# Patient Record
Sex: Female | Born: 1990 | Race: Asian | Hispanic: No | Marital: Married | Smoking: Never smoker
Health system: Southern US, Community
[De-identification: ages and names within clinical notes are randomized; demographics above are authoritative.]

## PROBLEM LIST (undated history)

## (undated) ENCOUNTER — Inpatient Hospital Stay (HOSPITAL_COMMUNITY): Payer: Self-pay

## (undated) DIAGNOSIS — Z789 Other specified health status: Secondary | ICD-10-CM

## (undated) HISTORY — PX: APPENDECTOMY: SHX54

---
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
## 2012-03-13 ENCOUNTER — Emergency Department (INDEPENDENT_AMBULATORY_CARE_PROVIDER_SITE_OTHER)
Admission: EM | Admit: 2012-03-13 | Discharge: 2012-03-13 | Disposition: A | Discharge status: Home / Self Care | Payer: Self-pay | Source: Home / Self Care | Attending: Emergency Medicine | Admitting: Emergency Medicine

## 2012-03-13 ENCOUNTER — Encounter (HOSPITAL_COMMUNITY): Payer: Self-pay | Admitting: Emergency Medicine

## 2012-03-13 DIAGNOSIS — G43909 Migraine, unspecified, not intractable, without status migrainosus: Secondary | ICD-10-CM

## 2012-03-13 DIAGNOSIS — L42 Pityriasis rosea: Secondary | ICD-10-CM

## 2012-03-13 LAB — POCT URINALYSIS DIP (DEVICE)
Glucose, UA: NEGATIVE mg/dL
Hgb urine dipstick: NEGATIVE
Nitrite: NEGATIVE
Protein, ur: NEGATIVE mg/dL
Urobilinogen, UA: 0.2 mg/dL (ref 0.0–1.0)
pH: 6 (ref 5.0–8.0)

## 2012-03-13 MED ORDER — KETOROLAC TROMETHAMINE 30 MG/ML IJ SOLN
INTRAMUSCULAR | Status: AC
  Filled 2012-03-13: qty 1

## 2012-03-13 MED ORDER — DIPHENHYDRAMINE HCL 25 MG PO CAPS
ORAL_CAPSULE | ORAL | Status: AC
  Filled 2012-03-13: qty 1

## 2012-03-13 MED ORDER — DEXAMETHASONE SODIUM PHOSPHATE 10 MG/ML IJ SOLN
INTRAMUSCULAR | Status: AC
  Filled 2012-03-13: qty 1

## 2012-03-13 MED ORDER — DIPHENHYDRAMINE HCL 25 MG PO CAPS
25.0000 mg | ORAL_CAPSULE | Freq: Once | ORAL | Status: AC
  Administered 2012-03-13: 25 mg via ORAL

## 2012-03-13 MED ORDER — DEXAMETHASONE SODIUM PHOSPHATE 10 MG/ML IJ SOLN
10.0000 mg | Freq: Once | INTRAMUSCULAR | Status: AC
  Administered 2012-03-13: 10 mg via INTRAMUSCULAR

## 2012-03-13 MED ORDER — KETOROLAC TROMETHAMINE 30 MG/ML IJ SOLN
30.0000 mg | Freq: Once | INTRAMUSCULAR | Status: AC
  Administered 2012-03-13: 30 mg via INTRAMUSCULAR

## 2012-03-13 NOTE — ED Provider Notes (Signed)
History     CSN: [id number]  Arrival date & time 03/13/12  1735   First MD Initiated Contact with Patient 03/13/12 2017      Chief Complaint  Patient presents with  . Dizziness    (Consider location/radiation/quality/duration/timing/severity/associated sxs/prior treatment) HPI Comments: Pt with similar headache about a year ago.  Did not treat it, eventually it went away.  Today tried otc migraine medicine which helped a little.    Patient is a 21 y.o. female presenting with headaches and rash. The history is provided by the patient and a friend.  Headache The primary symptoms include headaches, dizziness and nausea. Primary symptoms do not include visual change, fever or vomiting. The symptoms began 6 to 12 hours ago. The symptoms are unchanged.  The headache is associated with photophobia. The headache is not associated with visual change or neck stiffness.  Dizziness also occurs with nausea. Dizziness does not occur with vomiting.  Additional symptoms include photophobia. Additional symptoms do not include neck stiffness.  Rash  This is a new problem. The current episode started 6 to 12 hours ago. The problem has been gradually worsening. The problem is associated with nothing. There has been no fever. The rash is present on the abdomen and neck. The pain is at a severity of 0/10. Associated symptoms include itching. She has tried nothing for the symptoms.    Past Medical History  Diagnosis Date  . No pertinent past medical history     Past Surgical History  Procedure Date  . No past surgeries     No family history on file.  History  Substance Use Topics  . Smoking status: Never Smoker   . Smokeless tobacco: Not on file  . Alcohol Use: No    OB History    Grav Para Term Preterm Abortions TAB SAB Ect Mult Living   1 1 1              Review of Systems  Constitutional: Negative for fever and chills.  HENT: Negative for congestion, neck stiffness and sinus  pressure.   Eyes: Positive for photophobia.  Gastrointestinal: Positive for nausea. Negative for vomiting and abdominal pain.  Genitourinary: Negative for flank pain.  Musculoskeletal: Negative for back pain.  Skin: Positive for itching and rash.  Neurological: Positive for dizziness and headaches.    Allergies  Review of patient's allergies indicates no known allergies.  Home Medications   Current Outpatient Rx  Name Route Sig Dispense Refill  . PRENATAL MULTIVITAMIN CH Oral Take 1 tablet by mouth daily. 30 tablet 5  . SENNOSIDES-DOCUSATE SODIUM 8.6-50 MG PO TABS Oral Take 2 tablets by mouth at bedtime. Until you have a bowel movement. 30 tablet 0    BP 99/72  Pulse 93  LMP 03/03/2012  Physical Exam  Constitutional: She is oriented to person, place, and time. She appears well-developed and well-nourished.       Uncomfortable appearing, lying on table with eyes closed.   HENT:  Head: Normocephalic and atraumatic.  Right Ear: Tympanic membrane, external ear and ear canal normal.  Left Ear: Tympanic membrane, external ear and ear canal normal.  Nose: Nose normal.  Mouth/Throat: Oropharynx is clear and moist.  Eyes: EOM are normal. Pupils are equal, round, and reactive to light.       Photophobia during exam  Neck: Normal range of motion. Neck supple.  Cardiovascular: Normal rate and regular rhythm.   Pulmonary/Chest: Breath sounds normal.  Abdominal: Soft. Bowel sounds are normal.  She exhibits no distension. There is no tenderness. There is no rebound and no guarding.  Neurological: She is alert and oriented to person, place, and time.  Skin: Skin is warm and dry. Rash noted. Rash is papular.       Scattered papules on abd, chest and anterior neck appear c/w pityriasis rosea.  No lesions noted on back.     ED Course  Procedures (including critical care time)   Labs Reviewed  POCT URINALYSIS DIP (DEVICE)  POCT PREGNANCY, URINE   No results found.   1. Migraine     2. Pityriasis rosea       MDM  Dr. Lorenz Coaster examined pt's skin with me.  Likely early pityriasis rosea.         Cathlyn Parsons, NP 03/13/12 2105

## 2012-03-13 NOTE — ED Provider Notes (Signed)
Medical screening examination/treatment/procedure(s) were performed by non-physician practitioner and as supervising physician I was immediately available for consultation/collaboration.  Leslee Home, M.D.   Reuben Likes, MD 03/13/12 [phone]

## 2012-03-13 NOTE — Discharge Instructions (Signed)
Please go home and rest.  Drink lots of liquids to keep yourself hydrated.  If you get another headache like this, you should see a regular (family) doctor - you may need medicines for your headaches.

## 2012-03-13 NOTE — ED Notes (Signed)
Pt here with sudden onset dizziness,sensitivity to light and bilat temporal h/a that started this morning.also states poor appetite for 2 mnths.nausea but no vomiting.feels faint.LMP 03/03/12

## 2012-08-21 ENCOUNTER — Observation Stay (HOSPITAL_COMMUNITY): Payer: Medicaid Other | Admitting: Anesthesiology

## 2012-08-21 ENCOUNTER — Inpatient Hospital Stay (HOSPITAL_COMMUNITY)
Admission: EM | Admit: 2012-08-21 | Discharge: 2012-08-23 | DRG: 343 | Disposition: A | Discharge status: Home / Self Care | Payer: Medicaid Other | Attending: General Surgery | Admitting: General Surgery

## 2012-08-21 ENCOUNTER — Emergency Department (HOSPITAL_COMMUNITY): Payer: Medicaid Other

## 2012-08-21 ENCOUNTER — Encounter (HOSPITAL_COMMUNITY): Payer: Self-pay | Admitting: Anesthesiology

## 2012-08-21 ENCOUNTER — Encounter (HOSPITAL_COMMUNITY): Admission: EM | Disposition: A | Discharge status: Home / Self Care | Payer: Self-pay | Source: Home / Self Care

## 2012-08-21 ENCOUNTER — Encounter (HOSPITAL_COMMUNITY): Payer: Self-pay | Admitting: Emergency Medicine

## 2012-08-21 DIAGNOSIS — K37 Unspecified appendicitis: Secondary | ICD-10-CM

## 2012-08-21 DIAGNOSIS — R110 Nausea: Secondary | ICD-10-CM | POA: Diagnosis not present

## 2012-08-21 DIAGNOSIS — K3580 Unspecified acute appendicitis: Secondary | ICD-10-CM

## 2012-08-21 DIAGNOSIS — R109 Unspecified abdominal pain: Secondary | ICD-10-CM

## 2012-08-21 HISTORY — PX: LAPAROSCOPIC APPENDECTOMY: SHX408

## 2012-08-21 LAB — CBC
MCH: 25.8 pg — ABNORMAL LOW (ref 26.0–34.0)
MCHC: 33.2 g/dL (ref 30.0–36.0)
MCV: 77.5 fL — ABNORMAL LOW (ref 78.0–100.0)
Platelets: 211 10*3/uL (ref 150–400)
RDW: 13.2 % (ref 11.5–15.5)

## 2012-08-21 LAB — COMPREHENSIVE METABOLIC PANEL
AST: 14 U/L (ref 0–37)
Albumin: 4.1 g/dL (ref 3.5–5.2)
BUN: 13 mg/dL (ref 6–23)
Calcium: 9.8 mg/dL (ref 8.4–10.5)
Creatinine, Ser: 0.53 mg/dL (ref 0.50–1.10)

## 2012-08-21 LAB — URINALYSIS, ROUTINE W REFLEX MICROSCOPIC
Bilirubin Urine: NEGATIVE
Nitrite: NEGATIVE
Protein, ur: NEGATIVE mg/dL
Specific Gravity, Urine: 1.013 (ref 1.005–1.030)
Urobilinogen, UA: 0.2 mg/dL (ref 0.0–1.0)

## 2012-08-21 LAB — LIPASE, BLOOD: Lipase: 34 U/L (ref 11–59)

## 2012-08-21 LAB — PREGNANCY, URINE: Preg Test, Ur: NEGATIVE

## 2012-08-21 LAB — AMYLASE: Amylase: 84 U/L (ref 0–105)

## 2012-08-21 SURGERY — APPENDECTOMY, LAPAROSCOPIC
Anesthesia: General | Site: Abdomen | Wound class: Contaminated

## 2012-08-21 MED ORDER — ONDANSETRON HCL 4 MG/2ML IJ SOLN
4.0000 mg | Freq: Four times a day (QID) | INTRAMUSCULAR | Status: DC | PRN
  Administered 2012-08-22: 4 mg via INTRAVENOUS
  Filled 2012-08-21: qty 2

## 2012-08-21 MED ORDER — GLYCOPYRROLATE 0.2 MG/ML IJ SOLN
INTRAMUSCULAR | Status: DC | PRN
  Administered 2012-08-21: 0.6 mg via INTRAVENOUS

## 2012-08-21 MED ORDER — DEXAMETHASONE SODIUM PHOSPHATE 10 MG/ML IJ SOLN
INTRAMUSCULAR | Status: DC | PRN
  Administered 2012-08-21: 10 mg via INTRAVENOUS

## 2012-08-21 MED ORDER — NEOSTIGMINE METHYLSULFATE 1 MG/ML IJ SOLN
INTRAMUSCULAR | Status: DC | PRN
  Administered 2012-08-21: 5 mg via INTRAVENOUS

## 2012-08-21 MED ORDER — ROCURONIUM BROMIDE 100 MG/10ML IV SOLN
INTRAVENOUS | Status: DC | PRN
  Administered 2012-08-21: 30 mg via INTRAVENOUS

## 2012-08-21 MED ORDER — ACETAMINOPHEN 10 MG/ML IV SOLN
INTRAVENOUS | Status: AC
  Filled 2012-08-21: qty 100

## 2012-08-21 MED ORDER — PROMETHAZINE HCL 25 MG/ML IJ SOLN
INTRAMUSCULAR | Status: AC
  Filled 2012-08-21: qty 1

## 2012-08-21 MED ORDER — ONDANSETRON HCL 4 MG/2ML IJ SOLN
INTRAMUSCULAR | Status: DC | PRN
  Administered 2012-08-21: 4 mg via INTRAVENOUS

## 2012-08-21 MED ORDER — PIPERACILLIN-TAZOBACTAM 3.375 G IVPB: 3.3750 g | Freq: Three times a day (TID) | INTRAVENOUS | Status: DC

## 2012-08-21 MED ORDER — LACTATED RINGERS IR SOLN
Status: DC | PRN
  Administered 2012-08-21: 1000 mL

## 2012-08-21 MED ORDER — IOHEXOL 300 MG/ML  SOLN
80.0000 mL | Freq: Once | INTRAMUSCULAR | Status: AC | PRN
  Administered 2012-08-21: 80 mL via INTRAVENOUS

## 2012-08-21 MED ORDER — PROPOFOL 10 MG/ML IV BOLUS
INTRAVENOUS | Status: DC | PRN
  Administered 2012-08-21: 120 mg via INTRAVENOUS

## 2012-08-21 MED ORDER — SUCCINYLCHOLINE CHLORIDE 20 MG/ML IJ SOLN
INTRAMUSCULAR | Status: DC | PRN
  Administered 2012-08-21: 100 mg via INTRAVENOUS

## 2012-08-21 MED ORDER — BUPIVACAINE-EPINEPHRINE 0.5% -1:200000 IJ SOLN
INTRAMUSCULAR | Status: DC | PRN
  Administered 2012-08-21: 25 mL

## 2012-08-21 MED ORDER — SODIUM CHLORIDE 0.9 % IV SOLN
3.0000 g | Freq: Four times a day (QID) | INTRAVENOUS | Status: DC
  Administered 2012-08-21: 3 g via INTRAVENOUS
  Filled 2012-08-21 (×4): qty 3

## 2012-08-21 MED ORDER — CHLORHEXIDINE GLUCONATE CLOTH 2 % EX PADS: 6.0000 | MEDICATED_PAD | Freq: Every day | CUTANEOUS | Status: DC

## 2012-08-21 MED ORDER — ACETAMINOPHEN 325 MG PO TABS: 650.0000 mg | ORAL_TABLET | Freq: Four times a day (QID) | ORAL | Status: DC | PRN

## 2012-08-21 MED ORDER — MORPHINE SULFATE 4 MG/ML IJ SOLN
4.0000 mg | Freq: Once | INTRAMUSCULAR | Status: AC
  Administered 2012-08-21: 4 mg via INTRAVENOUS
  Filled 2012-08-21: qty 1

## 2012-08-21 MED ORDER — FENTANYL CITRATE 0.05 MG/ML IJ SOLN
INTRAMUSCULAR | Status: DC | PRN
  Administered 2012-08-21 (×5): 25 ug via INTRAVENOUS

## 2012-08-21 MED ORDER — BUPIVACAINE-EPINEPHRINE (PF) 0.5% -1:200000 IJ SOLN
INTRAMUSCULAR | Status: AC
  Filled 2012-08-21: qty 10

## 2012-08-21 MED ORDER — PROMETHAZINE HCL 25 MG/ML IJ SOLN
6.2500 mg | INTRAMUSCULAR | Status: AC | PRN
  Administered 2012-08-21 (×2): 6.25 mg via INTRAVENOUS

## 2012-08-21 MED ORDER — FENTANYL CITRATE 0.05 MG/ML IJ SOLN: 25.0000 ug | INTRAMUSCULAR | Status: DC | PRN

## 2012-08-21 MED ORDER — LACTATED RINGERS IV SOLN: INTRAVENOUS | Status: DC

## 2012-08-21 MED ORDER — ONDANSETRON HCL 4 MG/2ML IJ SOLN
4.0000 mg | Freq: Once | INTRAMUSCULAR | Status: AC
  Administered 2012-08-21: 4 mg via INTRAVENOUS
  Filled 2012-08-21: qty 2

## 2012-08-21 MED ORDER — HYDROCODONE-ACETAMINOPHEN 5-325 MG PO TABS
1.0000 | ORAL_TABLET | ORAL | Status: DC | PRN
  Administered 2012-08-22 – 2012-08-23 (×4): 1 via ORAL
  Filled 2012-08-21 (×4): qty 1
  Filled 2012-08-21: qty 2

## 2012-08-21 MED ORDER — ACETAMINOPHEN 10 MG/ML IV SOLN
INTRAVENOUS | Status: DC | PRN
  Administered 2012-08-21: 1000 mg via INTRAVENOUS

## 2012-08-21 MED ORDER — ACETAMINOPHEN 650 MG RE SUPP: 650.0000 mg | Freq: Four times a day (QID) | RECTAL | Status: DC | PRN

## 2012-08-21 MED ORDER — LACTATED RINGERS IV SOLN
INTRAVENOUS | Status: DC | PRN
  Administered 2012-08-21: 20:00:00 via INTRAVENOUS

## 2012-08-21 MED ORDER — KCL IN DEXTROSE-NACL 20-5-0.45 MEQ/L-%-% IV SOLN
INTRAVENOUS | Status: DC
  Administered 2012-08-22: 50 mL/h via INTRAVENOUS
  Administered 2012-08-22: 01:00:00 via INTRAVENOUS
  Filled 2012-08-21 (×3): qty 1000

## 2012-08-21 MED ORDER — MORPHINE SULFATE 2 MG/ML IJ SOLN: 1.0000 mg | INTRAMUSCULAR | Status: DC | PRN

## 2012-08-21 MED ORDER — MUPIROCIN 2 % EX OINT
1.0000 "application " | TOPICAL_OINTMENT | Freq: Two times a day (BID) | CUTANEOUS | Status: DC
  Administered 2012-08-22 – 2012-08-23 (×3): 1 via NASAL
  Filled 2012-08-21: qty 22

## 2012-08-21 MED ORDER — KCL IN DEXTROSE-NACL 20-5-0.45 MEQ/L-%-% IV SOLN
INTRAVENOUS | Status: DC
  Filled 2012-08-21 (×2): qty 1000

## 2012-08-21 SURGICAL SUPPLY — 41 items
APPLIER CLIP 5 13 M/L LIGAMAX5 (MISCELLANEOUS)
APPLIER CLIP ROT 10 11.4 M/L (STAPLE) ×2
CANISTER SUCTION 2500CC (MISCELLANEOUS) ×2 IMPLANT
CHLORAPREP W/TINT 26ML (MISCELLANEOUS) ×2 IMPLANT
CLIP APPLIE 5 13 M/L LIGAMAX5 (MISCELLANEOUS) IMPLANT
CLIP APPLIE ROT 10 11.4 M/L (STAPLE) ×1 IMPLANT
CLOTH BEACON ORANGE TIMEOUT ST (SAFETY) ×2 IMPLANT
CUTTER FLEX LINEAR 45M (STAPLE) ×2 IMPLANT
DECANTER SPIKE VIAL GLASS SM (MISCELLANEOUS) ×2 IMPLANT
DERMABOND ADVANCED (GAUZE/BANDAGES/DRESSINGS) ×1
DERMABOND ADVANCED .7 DNX12 (GAUZE/BANDAGES/DRESSINGS) ×1 IMPLANT
DRAPE LAPAROSCOPIC ABDOMINAL (DRAPES) ×2 IMPLANT
ELECT REM PT RETURN 9FT ADLT (ELECTROSURGICAL) ×2
ELECTRODE REM PT RTRN 9FT ADLT (ELECTROSURGICAL) ×1 IMPLANT
GLOVE BIOGEL PI IND STRL 7.0 (GLOVE) IMPLANT
GLOVE BIOGEL PI INDICATOR 7.0 (GLOVE)
GLOVE SS BIOGEL STRL SZ 7.5 (GLOVE) ×1 IMPLANT
GLOVE SUPERSENSE BIOGEL SZ 7.5 (GLOVE) ×1
GOWN STRL NON-REIN LRG LVL3 (GOWN DISPOSABLE) ×2 IMPLANT
GOWN STRL REIN XL XLG (GOWN DISPOSABLE) ×4 IMPLANT
IV LACTATED RINGERS 1000ML (IV SOLUTION) ×2 IMPLANT
KIT BASIN OR (CUSTOM PROCEDURE TRAY) ×2 IMPLANT
NS IRRIG 1000ML POUR BTL (IV SOLUTION) ×2 IMPLANT
PENCIL BUTTON HOLSTER BLD 10FT (ELECTRODE) IMPLANT
POUCH SPECIMEN RETRIEVAL 10MM (ENDOMECHANICALS) ×2 IMPLANT
RELOAD 45 VASCULAR/THIN (ENDOMECHANICALS) IMPLANT
RELOAD STAPLE TA45 3.5 REG BLU (ENDOMECHANICALS) ×2 IMPLANT
SCALPEL HARMONIC ACE (MISCELLANEOUS) ×2 IMPLANT
SET IRRIG TUBING LAPAROSCOPIC (IRRIGATION / IRRIGATOR) ×2 IMPLANT
SLEEVE Z-THREAD 5X100MM (TROCAR) IMPLANT
SOLUTION ANTI FOG 6CC (MISCELLANEOUS) ×2 IMPLANT
STRIP CLOSURE SKIN 1/2X4 (GAUZE/BANDAGES/DRESSINGS) IMPLANT
SUT MNCRL AB 4-0 PS2 18 (SUTURE) ×2 IMPLANT
SUT VICRYL 0 UR6 27IN ABS (SUTURE) ×2 IMPLANT
TOWEL OR 17X26 10 PK STRL BLUE (TOWEL DISPOSABLE) ×2 IMPLANT
TRAY FOLEY CATH 14FRSI W/METER (CATHETERS) ×2 IMPLANT
TRAY LAP CHOLE (CUSTOM PROCEDURE TRAY) ×2 IMPLANT
TROCAR XCEL BLUNT TIP 100MML (ENDOMECHANICALS) ×2 IMPLANT
TROCAR Z-THREAD FIOS 12X100MM (TROCAR) ×2 IMPLANT
TROCAR Z-THREAD FIOS 5X100MM (TROCAR) ×2 IMPLANT
TUBING INSUFFLATION 10FT LAP (TUBING) ×2 IMPLANT

## 2012-08-21 NOTE — ED Notes (Signed)
Tolerating po contrast.

## 2012-08-21 NOTE — ED Provider Notes (Signed)
History     CSN: [id number]  Arrival date & time 08/21/12  0820   First MD Initiated Contact with Patient 08/21/12 [phone]      Chief Complaint  Patient presents with  . Abdominal Pain    (Consider location/radiation/quality/duration/timing/severity/associated sxs/prior treatment) HPI Comments: Ms Valerie [name] presents for evaluation of abdominal pain.  She states she developed sudden onset upper abdominal pain about 2 hours ago.  It is associated with nausea but she has had no vomiting.  She denies any similar previous discomfort and a has had no abdominal surgeries.  She denies dysuria, pyuria, vaginal bleeding, dysmenorrhea, back pain, constipation, melena, BRBPR, and fever.  She denies alcohol or drug abuse.  She also denies any recent pre or post prandial pain.  Patient is a 21 y.o. female presenting with abdominal pain. The history is provided by the patient. No language interpreter was used.  Abdominal Pain The primary symptoms of the illness include abdominal pain, nausea and vomiting. The primary symptoms of the illness do not include fever, fatigue, shortness of breath, diarrhea, hematemesis, hematochezia, dysuria, vaginal discharge or vaginal bleeding. The onset of the illness was sudden. The problem has not changed since onset. The patient states that she believes she is currently not pregnant. The patient has not had a change in bowel habit. Symptoms associated with the illness do not include anorexia, diaphoresis, constipation, hematuria, frequency or back pain.    Past Medical History  Diagnosis Date  . No pertinent past medical history     Past Surgical History  Procedure Date  . No past surgeries     No family history on file.  History  Substance Use Topics  . Smoking status: Never Smoker   . Smokeless tobacco: Not on file  . Alcohol Use: No    OB History    Grav Para Term Preterm Abortions TAB SAB Ect Mult Living   1 1 1              Review of Systems    Constitutional: Negative for fever, diaphoresis and fatigue.  HENT: Negative.   Eyes: Negative.   Respiratory: Negative for cough, shortness of breath and wheezing.   Cardiovascular: Negative for chest pain, palpitations and leg swelling.  Gastrointestinal: Positive for nausea, vomiting and abdominal pain. Negative for diarrhea, constipation, blood in stool, hematochezia, abdominal distention, rectal pain, anorexia and hematemesis.  Genitourinary: Negative for dysuria, frequency, hematuria, flank pain, decreased urine volume, vaginal bleeding, vaginal discharge, difficulty urinating, genital sores, vaginal pain, menstrual problem (LNMP was at the end of last month) and dyspareunia.  Musculoskeletal: Negative for back pain.  Skin: Negative.   Neurological: Negative.   Hematological: Negative.   Psychiatric/Behavioral: Negative.     Allergies  Review of patient's allergies indicates no known allergies.  Home Medications  No current outpatient prescriptions on file.  BP 98/68  Pulse 91  Temp 97.7 F (36.5 C) (Oral)  Resp 22  SpO2 100%  Physical Exam  Nursing note and vitals reviewed. Constitutional: She is oriented to person, place, and time. She appears well-developed and well-nourished. She is cooperative. She appears ill. No distress.  HENT:  Head: Normocephalic and atraumatic.  Right Ear: External ear normal.  Left Ear: External ear normal.  Nose: Nose normal.  Mouth/Throat: Oropharynx is clear and moist. No oropharyngeal exudate.  Eyes: Conjunctivae normal are normal. Pupils are equal, round, and reactive to light. Right eye exhibits no discharge. Left eye exhibits no discharge. No scleral icterus.  Neck: Normal  range of motion. Neck supple. No JVD present. No tracheal deviation present.  Cardiovascular: Normal rate, regular rhythm, normal heart sounds and intact distal pulses.  Exam reveals no gallop and no friction rub.   No murmur heard. Pulmonary/Chest: Effort normal  and breath sounds normal. No stridor. No respiratory distress. She has no wheezes. She has no rales. She exhibits no tenderness.  Abdominal: Soft. Normal appearance. She exhibits no shifting dullness, no distension, no pulsatile liver, no fluid wave, no abdominal bruit, no ascites, no pulsatile midline mass and no mass. Bowel sounds are decreased. There is no hepatosplenomegaly. There is tenderness in the epigastric area. There is guarding. There is no rigidity, no rebound, no CVA tenderness, no tenderness at McBurney's point and negative Murphy's sign. No hernia.       Pain is all reproduced in the upper abdomen.   Musculoskeletal: Normal range of motion. She exhibits no edema.  Lymphadenopathy:    She has no cervical adenopathy.  Neurological: She is alert and oriented to person, place, and time.  Skin: Skin is warm. No rash noted. She is diaphoretic. No erythema. No pallor.  Psychiatric: She has a normal mood and affect. Her behavior is normal.    ED Course  Procedures (including critical care time)   Labs Reviewed  CBC  COMPREHENSIVE METABOLIC PANEL  LIPASE, BLOOD  AMYLASE  PREGNANCY, URINE  URINALYSIS, ROUTINE W REFLEX MICROSCOPIC   No results found.   No diagnosis found.    MDM  Pt presents for evaluation of upper abdominal pain.  She appears uncomfortable.  Note stable vital signs (borderline low BP but feel this is likely a normal value for her - will follow closely).  Pt has upper abd pain.  Will obtain basic belly labs and treat her pain/nausea.  Will reassess as results become available.  1450.  Pt stable, NAD.  Pt's pain started as upper abd pain but has now started to localize to the lower abdomen.  Discussed her CT findings with the reading radiologist.  Pt has evidence of an early acute appendicitis.  Consult placed with the general surgery group for further evaluation and mgmnt.      Tobin Chad, MD 08/21/12 [phone]

## 2012-08-21 NOTE — Anesthesia Preprocedure Evaluation (Addendum)
Anesthesia Evaluation  Patient identified by MRN, date of birth, ID band Patient awake    Reviewed: Allergy & Precautions, H&P , NPO status , Patient's Chart, lab work & pertinent test results  Airway Mallampati: II TM Distance: >3 FB Neck ROM: full    Dental No notable dental hx. (+) Teeth Intact and Dental Advisory Given   Pulmonary neg pulmonary ROS,  breath sounds clear to auscultation  Pulmonary exam normal       Cardiovascular Exercise Tolerance: Good negative cardio ROS  Rhythm:regular Rate:Normal     Neuro/Psych negative neurological ROS  negative psych ROS   GI/Hepatic negative GI ROS, Neg liver ROS,   Endo/Other  negative endocrine ROS  Renal/GU negative Renal ROS  negative genitourinary   Musculoskeletal   Abdominal   Peds  Hematology negative hematology ROS (+)   Anesthesia Other Findings   Reproductive/Obstetrics negative OB ROS                           Anesthesia Physical Anesthesia Plan  ASA: I and emergent  Anesthesia Plan: General   Post-op Pain Management:    Induction: Intravenous, Rapid sequence and Cricoid pressure planned  Airway Management Planned: Oral ETT  Additional Equipment:   Intra-op Plan:   Post-operative Plan: Extubation in OR  Informed Consent: I have reviewed the patients History and Physical, chart, labs and discussed the procedure including the risks, benefits and alternatives for the proposed anesthesia with the patient or authorized representative who has indicated his/her understanding and acceptance.   Dental Advisory Given  Plan Discussed with: CRNA and Surgeon  Anesthesia Plan Comments:         Anesthesia Quick Evaluation  

## 2012-08-21 NOTE — Preoperative (Signed)
Beta Blockers   Reason not to administer Beta Blockers:Not Applicable 

## 2012-08-21 NOTE — H&P (Signed)
Agree with above.  Early acute appendicitis with no sign of perforation or abscess.  Recommend laparoscopic appendectomy.  The surgical procedure has been discussed with the patient.  Potential risks, benefits, alternative treatments, and expected outcomes have been explained.  All of the patient's questions at this time have been answered.  The likelihood of reaching the patient's treatment goal is good.  The patient understand the proposed surgical procedure and wishes to proceed.  Dr. Johna Sheriff is on call tonight and will assume her care.  Wilmon Arms. Corliss Skains, MD, [facility] Surgery  08/21/2012 4:49 PM

## 2012-08-21 NOTE — Progress Notes (Signed)
[facility] ED CM noted no pcp listed for medicaid pt Spoke with pt and her female family member at bedside who states pt does not have a pcp listed on her medicaid card CM reviewed scanned active medicaid card since 2012 and no pcp listed

## 2012-08-21 NOTE — ED Notes (Signed)
Pt desat to 88% post morphine admin. Pt placed on 2L,now 100%. Family at bedside

## 2012-08-21 NOTE — ED Notes (Signed)
Called CT to notify pt completed contrast

## 2012-08-21 NOTE — Op Note (Signed)
Preoperative Diagnosis: Appendicitis [541] ABD PAIN  Postoprative Diagnosis: Appendicitis [541] ABD PAIN  Procedure: Procedure(s): APPENDECTOMY LAPAROSCOPIC   Surgeon: Glenna Fellows T   Assistants: None  Anesthesia:  General endotracheal anesthesiaDiagnos  Indications:   Patient is a 21 year old female with approximately 24 hours of periumbilical and right lower quadrant abdominal pain. CT scan has shown evidence of acute appendicitis. We have recommended proceeding with emergency laparoscopic appendectomy and she is in agreement.  Procedure Detail:  Patient is brought to the operating room, placed in the supine position on the operating table, and general endotracheal anesthesia induced. She had received preoperative IV antibiotics. Foley catheter was placed. The abdomen was widely sterilely prepped and draped. Patient timeout was performed and correct procedure verified. Access was obtained with a 1 cm incision at the umbilicus with an open Hassan technique and the trocar was placed through a mattress suture of 0 Vicryl. Under direct vision a 5 mm trocar was placed in the right upper quadrant and a 12 mm trocar in the left lower quadrant. The appendix was easily identified lying medial and inferior to the cecum and was acutely inflamed with edema and very early exudate at the tip. The base was normal. The appendix was elevated and the mesal appendix was sequentially divided with the harmonic scalpel until the appendix was completely freed down to the tip of the cecum. The appendix was then divided across the tip of the cecum with a single firing of the Endo GIA 45 mm blue load stapler. The appendix was placed in an Endo Catch bag and brought out through the umbilical incision. The abdomen was irrigated and complete hemostasis assured. Trochars were removed under direct vision all CO2 evacuated. A mattress suture of 0 Vicryl was placed in the anterior fashion the left lower quadrant trocar  site. Skin incisions were closed with subcuticular Monocryl and Dermabond. Patient was taken to recovery in good condition.  Findings: Early acute appendicitis  Estimated Blood Loss:  Minimal         Drains: none  Blood Given: none          Specimens: appendix        Complications:  * No complications entered in OR log *         Disposition: PACU - hemodynamically stable.         Condition: stable

## 2012-08-21 NOTE — Progress Notes (Signed)
[facility] ED Cm spoke with mother and provided her with a list of available [place] [employer] and other resources including DSS, health department, medication resources and financial resources Mother voiced appreciation

## 2012-08-21 NOTE — H&P (Signed)
Valerie [name] is an 21 y.o. female.   Chief Complaint: Acute onset of abdominal pain HPI: Patient's a 21 year old woman from [place] who started having abdominal pain this morning. It appears to be located initially around her umbilicus more the right side. Pain got worse and she developed nausea.  She came to the ER.  Work up here shows a WBC of 9.9. H./H.= 12.0/36, CMP is normal, lipase is normal. CT scan shows a normal liver spleen pancreas gallbladder adrenal kidneys. There is no small bowel obstruction or diverticulitis the appendix shows wall enhancement measuring 7.1 mm at the tip and has minimal stranding around the tip. They thought it was suspicious for early appendicitis. In the ER her pain is better with pain medication, she has no further nausea. She has ongoing discomfort mostly on the right side going to her back. Her tenderness is primarily in the right lower quadrant. We were asked to see in consultation. Post partum 10 months, breast feeding.  Past Medical History  Diagnosis Date  . She has been seen once in ER here for headache     Past Surgical History  Procedure Date  . No past surgeries     No family history on file. Social History:  reports that she has never smoked. She does not have any smokeless tobacco history on file. She reports that she does not drink alcohol or use illicit drugs.Both parents are in good health, sister is in good health.  She has a 5 month old in good health.  Prior to Admission medications   Not on File    Allergies: No Known Allergies   (Not in a hospital admission)  Results for orders placed during the hospital encounter of 08/21/12 (from the past 48 hour(s))  CBC     Status: Abnormal   Collection Time   08/21/12  9:22 AM      Component Value Range Comment   WBC 9.9  4.0 - 10.5 K/uL    RBC 4.66  3.87 - 5.11 MIL/uL    Hemoglobin 12.0  12.0 - 15.0 g/dL    HCT 16.1  09.6 - 04.5 %    MCV 77.5 (*) 78.0 - 100.0 fL    MCH 25.8 (*) 26.0 -  34.0 pg    MCHC 33.2  30.0 - 36.0 g/dL    RDW 40.9  81.1 - 91.4 %    Platelets 211  150 - 400 K/uL   COMPREHENSIVE METABOLIC PANEL     Status: Abnormal   Collection Time   08/21/12  9:22 AM      Component Value Range Comment   Sodium 139  135 - 145 mEq/L    Potassium 3.5  3.5 - 5.1 mEq/L    Chloride 103  96 - 112 mEq/L    CO2 20  19 - 32 mEq/L    Glucose, Bld 106 (*) 70 - 99 mg/dL    BUN 13  6 - 23 mg/dL    Creatinine, Ser 7.82  0.50 - 1.10 mg/dL    Calcium 9.8  8.4 - 95.6 mg/dL    Total Protein 7.2  6.0 - 8.3 g/dL    Albumin 4.1  3.5 - 5.2 g/dL    AST 14  0 - 37 U/L    ALT 6  0 - 35 U/L    Alkaline Phosphatase 57  39 - 117 U/L    Total Bilirubin 0.3  0.3 - 1.2 mg/dL    GFR calc non Af  Amer >90  >90 mL/min    GFR calc Af Amer >90  >90 mL/min   LIPASE, BLOOD     Status: Normal   Collection Time   08/21/12  9:22 AM      Component Value Range Comment   Lipase 34  11 - 59 U/L   AMYLASE     Status: Normal   Collection Time   08/21/12  9:22 AM      Component Value Range Comment   Amylase 84  0 - 105 U/L   PREGNANCY, URINE     Status: Normal   Collection Time   08/21/12 10:12 AM      Component Value Range Comment   Preg Test, Ur NEGATIVE  NEGATIVE   URINALYSIS, ROUTINE W REFLEX MICROSCOPIC     Status: Normal   Collection Time   08/21/12 10:12 AM      Component Value Range Comment   Color, Urine YELLOW  YELLOW    APPearance CLEAR  CLEAR    Specific Gravity, Urine 1.013  1.005 - 1.030    pH 7.5  5.0 - 8.0    Glucose, UA NEGATIVE  NEGATIVE mg/dL    Hgb urine dipstick NEGATIVE  NEGATIVE    Bilirubin Urine NEGATIVE  NEGATIVE    Ketones, ur NEGATIVE  NEGATIVE mg/dL    Protein, ur NEGATIVE  NEGATIVE mg/dL    Urobilinogen, UA 0.2  0.0 - 1.0 mg/dL    Nitrite NEGATIVE  NEGATIVE    Leukocytes, UA NEGATIVE  NEGATIVE MICROSCOPIC NOT DONE ON URINES WITH NEGATIVE PROTEIN, BLOOD, LEUKOCYTES, NITRITE, OR GLUCOSE <1000 mg/dL.   Ct Abdomen Pelvis W Contrast  08/21/2012  *RADIOLOGY  REPORT*  Clinical Data: Right lower quadrant abdomen pain  CT ABDOMEN AND PELVIS WITH CONTRAST  Technique:  Multidetector CT imaging of the abdomen and pelvis was performed following the standard protocol during bolus administration of intravenous contrast.  Contrast: 80mL OMNIPAQUE IOHEXOL 300 MG/ML  SOLN  Comparison: None.  Findings: The liver, spleen, pancreas, gallbladder, adrenal glands and kidneys are normal.  The aorta is normal.  There is no small bowel obstruction or diverticulitis.  The appendix demonstrates wall enhancement, measuring 7.1 mm in the tip and has minimal surrounding stranding along the tip.  Findings are suspicious for early appendicitis.  Images of the pelvis demonstrate fluid filled bladder.  Fluid is identified within normal enhancing uterus, normal for the patient's age.  There is a 2.3 cm cyst in the left ovary.  The lung bases are clear.  IMPRESSION: Findings suspicious for early appendicitis as described.  I discussed the results over the phone with the patient's ER physician Dr. Lorenso Courier on the day of exam at 2:50 p.m.   Original Report Authenticated By: Sherian Rein, M.D.     Review of Systems  Constitutional: Negative for fever, chills, weight loss, malaise/fatigue and diaphoresis.  HENT: Negative.   Eyes: Negative.   Respiratory: Negative.   Cardiovascular: Negative.   Gastrointestinal: Positive for nausea and abdominal pain (abdominal pain mid abdomen at first, now going to RLQ and back.). Negative for diarrhea, constipation, blood in stool and melena.  Genitourinary: Negative.   Musculoskeletal: Negative.   Skin: Negative.   Neurological: Negative.  Negative for weakness.  Endo/Heme/Allergies: Negative.   Psychiatric/Behavioral: Negative.     Blood pressure 109/58, pulse 72, temperature 97.7 F (36.5 C), temperature source Oral, resp. rate 14, SpO2 100.00%. Physical Exam  Constitutional: She is oriented to person, place, and time. She appears well-developed  and well-nourished. No distress.       Thin, female from [place]. She is breast feeding at home. No acute distress, but very uncomfortable. She is from [place] and her cousin is translating for her.   HENT:  Head: Normocephalic and atraumatic.  Nose: Nose normal.  Eyes: Conjunctivae normal and EOM are normal. Pupils are equal, round, and reactive to light. Right eye exhibits no discharge. Left eye exhibits no discharge. No scleral icterus.  Neck: Normal range of motion. Neck supple. No JVD present. No thyromegaly present.  Cardiovascular: Normal rate, regular rhythm, normal heart sounds and intact distal pulses.  Exam reveals no gallop.   No murmur heard. Respiratory: Effort normal and breath sounds normal. No stridor. No respiratory distress. She has no wheezes. She has no rales. She exhibits no tenderness.  GI: Soft. Bowel sounds are normal. She exhibits no distension and no mass. There is tenderness (Pain is currently mostly RLQ and goes to her back.  ). There is no rebound and no guarding.       She says pain is better since pain med.  Musculoskeletal: Normal range of motion. She exhibits no edema.  Lymphadenopathy:    She has no cervical adenopathy.  Neurological: She is alert and oriented to person, place, and time. No cranial nerve deficit.  Skin: Skin is warm and dry. No rash noted. She is not diaphoretic. No erythema. No pallor.  Psychiatric: She has a normal mood and affect. Her behavior is normal. Judgment and thought content normal.     Assessment/Plan 1. Acute appendicitis  Plan:  Will discuss with Dr. Corliss Skains, probable appendectomy tonight.  Tzion Wedel 08/21/2012, 4:03 PM

## 2012-08-21 NOTE — ED Notes (Signed)
Pt scheduled for OR @ 1915. Called OR and was notified that surgery is behind and to go ahead and send pt to room

## 2012-08-21 NOTE — Anesthesia Postprocedure Evaluation (Signed)
  Anesthesia Post-op Note  Patient: Valerie [name]  Procedure(s) Performed: Procedure(s) (LRB): APPENDECTOMY LAPAROSCOPIC (N/A)  Patient Location: PACU  Anesthesia Type: General  Level of Consciousness: awake and alert   Airway and Oxygen Therapy: Patient Spontanous Breathing  Post-op Pain: mild  Post-op Assessment: Post-op Vital signs reviewed, Patient's Cardiovascular Status Stable, Respiratory Function Stable, Patent Airway and No signs of Nausea or vomiting  Last Vitals:  Filed Vitals:   08/21/12 2145  BP: 108/60  Pulse: 94  Temp:   Resp:     Post-op Vital Signs: stable   Complications: No apparent anesthesia complications

## 2012-08-21 NOTE — ED Notes (Signed)
Pt in from home vis [facility]. Pt c/o sudden onset of Abd pain in epigastric region this am. Pt denies N/V/D or fever. Pt in NAD.

## 2012-08-21 NOTE — Transfer of Care (Signed)
Immediate Anesthesia Transfer of Care Note  Patient: Valerie [name]  Procedure(s) Performed: Procedure(s) (LRB): APPENDECTOMY LAPAROSCOPIC (N/A)  Patient Location: PACU  Anesthesia Type: General  Level of Consciousness: sedated, patient cooperative and responds to stimulaton  Airway & Oxygen Therapy: Patient Spontanous Breathing and Patient connected to face mask oxgen  Post-op Assessment: Report given to PACU RN and Post -op Vital signs reviewed and stable  Post vital signs: Reviewed and stable  Complications: No apparent anesthesia complications

## 2012-08-21 NOTE — Progress Notes (Signed)
Pt also seen by [facility] liaison to offer self pay primary care providers in [employer] and health reform information

## 2012-08-22 NOTE — Progress Notes (Signed)
Patient ID: Valerie [name], female   DOB: August 28, 1991, 21 y.o.   MRN: [id number] [facility] Progress Note:   1 Day Post-Op  Subjective: Mental status is clear.  She understands very well.  Objective: Vital signs in last 24 hours: Temp:  [97.7 F (36.5 C)-99.2 F (37.3 C)] 98.3 F (36.8 C) (11/23 0546) Pulse Rate:  [63-95] 71  (11/23 0546) Resp:  [11-22] 16  (11/23 0546) BP: (98-116)/(54-72) 99/63 mmHg (11/23 0546) SpO2:  [91 %-100 %] 100 % (11/23 0546) Weight:  [139 lb (63.05 kg)] 139 lb (63.05 kg) (11/22 1905)  Intake/Output from previous day: 11/22 0701 - 11/23 0700 In: 1750 [I.V.:1750] Out: 2000 [Urine:2000] Intake/Output this shift:    Physical Exam: Work of breathing is normal.  Incisions are bland and covered with Dermabond  Lab Results:  Results for orders placed during the hospital encounter of 08/21/12 (from the past 48 hour(s))  CBC     Status: Abnormal   Collection Time   08/21/12  9:22 AM      Component Value Range Comment   WBC 9.9  4.0 - 10.5 K/uL    RBC 4.66  3.87 - 5.11 MIL/uL    Hemoglobin 12.0  12.0 - 15.0 g/dL    HCT 28.4  13.2 - 44.0 %    MCV 77.5 (*) 78.0 - 100.0 fL    MCH 25.8 (*) 26.0 - 34.0 pg    MCHC 33.2  30.0 - 36.0 g/dL    RDW 10.2  72.5 - 36.6 %    Platelets 211  150 - 400 K/uL   COMPREHENSIVE METABOLIC PANEL     Status: Abnormal   Collection Time   08/21/12  9:22 AM      Component Value Range Comment   Sodium 139  135 - 145 mEq/L    Potassium 3.5  3.5 - 5.1 mEq/L    Chloride 103  96 - 112 mEq/L    CO2 20  19 - 32 mEq/L    Glucose, Bld 106 (*) 70 - 99 mg/dL    BUN 13  6 - 23 mg/dL    Creatinine, Ser 4.40  0.50 - 1.10 mg/dL    Calcium 9.8  8.4 - 34.7 mg/dL    Total Protein 7.2  6.0 - 8.3 g/dL    Albumin 4.1  3.5 - 5.2 g/dL    AST 14  0 - 37 U/L    ALT 6  0 - 35 U/L    Alkaline Phosphatase 57  39 - 117 U/L    Total Bilirubin 0.3  0.3 - 1.2 mg/dL    GFR calc non Af Amer >90  >90 mL/min    GFR calc Af Amer >90  >90 mL/min     LIPASE, BLOOD     Status: Normal   Collection Time   08/21/12  9:22 AM      Component Value Range Comment   Lipase 34  11 - 59 U/L   AMYLASE     Status: Normal   Collection Time   08/21/12  9:22 AM      Component Value Range Comment   Amylase 84  0 - 105 U/L   PREGNANCY, URINE     Status: Normal   Collection Time   08/21/12 10:12 AM      Component Value Range Comment   Preg Test, Ur NEGATIVE  NEGATIVE   URINALYSIS, ROUTINE W REFLEX MICROSCOPIC     Status: Normal   Collection Time  08/21/12 10:12 AM      Component Value Range Comment   Color, Urine YELLOW  YELLOW    APPearance CLEAR  CLEAR    Specific Gravity, Urine 1.013  1.005 - 1.030    pH 7.5  5.0 - 8.0    Glucose, UA NEGATIVE  NEGATIVE mg/dL    Hgb urine dipstick NEGATIVE  NEGATIVE    Bilirubin Urine NEGATIVE  NEGATIVE    Ketones, ur NEGATIVE  NEGATIVE mg/dL    Protein, ur NEGATIVE  NEGATIVE mg/dL    Urobilinogen, UA 0.2  0.0 - 1.0 mg/dL    Nitrite NEGATIVE  NEGATIVE    Leukocytes, UA NEGATIVE  NEGATIVE MICROSCOPIC NOT DONE ON URINES WITH NEGATIVE PROTEIN, BLOOD, LEUKOCYTES, NITRITE, OR GLUCOSE <1000 mg/dL.  SURGICAL PCR SCREEN     Status: Abnormal   Collection Time   08/21/12  7:46 PM      Component Value Range Comment   MRSA, PCR NEGATIVE  NEGATIVE    Staphylococcus aureus POSITIVE (*) NEGATIVE     Radiology/Results: Ct Abdomen Pelvis W Contrast  08/21/2012  *RADIOLOGY REPORT*  Clinical Data: Right lower quadrant abdomen pain  CT ABDOMEN AND PELVIS WITH CONTRAST  Technique:  Multidetector CT imaging of the abdomen and pelvis was performed following the standard protocol during bolus administration of intravenous contrast.  Contrast: 80mL OMNIPAQUE IOHEXOL 300 MG/ML  SOLN  Comparison: None.  Findings: The liver, spleen, pancreas, gallbladder, adrenal glands and kidneys are normal.  The aorta is normal.  There is no small bowel obstruction or diverticulitis.  The appendix demonstrates wall enhancement, measuring 7.1  mm in the tip and has minimal surrounding stranding along the tip.  Findings are suspicious for early appendicitis.  Images of the pelvis demonstrate fluid filled bladder.  Fluid is identified within normal enhancing uterus, normal for the patient's age.  There is a 2.3 cm cyst in the left ovary.  The lung bases are clear.  IMPRESSION: Findings suspicious for early appendicitis as described.  I discussed the results over the phone with the patient's ER physician Dr. Lorenso Courier on the day of exam at 2:50 p.m.   Original Report Authenticated By: Sherian Rein, M.D.     Anti-infectives: Anti-infectives     Start     Dose/Rate Route Frequency Ordered Stop   08/21/12 1645   piperacillin-tazobactam (ZOSYN) IVPB 3.375 g  Status:  Discontinued        3.375 g 12.5 mL/hr over 240 Minutes Intravenous 3 times per day 08/21/12 1648 08/21/12 1649   08/21/12 1645   Ampicillin-Sulbactam (UNASYN) 3 g in sodium chloride 0.9 % 100 mL IVPB  Status:  Discontinued        3 g 100 mL/hr over 60 Minutes Intravenous Every 6 hours 08/21/12 1648 08/21/12 2238          Assessment/Plan: Problem List: Patient Active Problem List  Diagnosis  . Supervision of normal pregnancy    Doing well post appendectomy.  Begin clear liquids.  Hopeful discharge later today or tomorrow 1 Day Post-Op    LOS: 1 day   Matt B. Daphine Deutscher, MD, [facility] [phone] beeper [phone]  08/22/2012 7:59 AM

## 2012-08-22 NOTE — Progress Notes (Signed)
Pt took 90% of clear liquid tray. Up to walk in hall. Became nauseated, no vomiting. Returned to bed and zofran given.  Slept a short while and states feels better now and desires to go home. Dr Daphine Deutscher paged.

## 2012-08-23 MED ORDER — HYDROCODONE-ACETAMINOPHEN 5-325 MG PO TABS: 1.0000 | ORAL_TABLET | ORAL | Status: DC | PRN

## 2012-08-23 NOTE — Discharge Summary (Signed)
Physician Discharge Summary  Patient ID: Valerie [name] MRN: [id number] DOB/AGE: [birth year] 21 y.o.  Admit date: 08/21/2012 Discharge date: 08/23/2012  Admission Diagnoses:  appendicitis  Discharge Diagnoses:  same  Active Problems:  * No active hospital problems. *    Surgery:  Laparoscopic appendectomy  Discharged Condition: improved  Hospital Course:   Had lap appy by Dr. Johna Sheriff.  Did well.  Resumed breast pumping.    Consults: none  Significant Diagnostic Studies: none    Discharge Exam: Blood pressure 87/38, pulse 72, temperature 98.1 F (36.7 C), temperature source Oral, resp. rate 16, height 5\' 3"  (1.6 m), weight 139 lb (63.05 kg), last menstrual period 08/07/2012, SpO2 99.00%. Incisions bland.  Taking diet ok.    Disposition: 01-Home or Self Care  Discharge Orders    Future Orders Please Complete By Expires   Diet - low sodium heart healthy      Increase activity slowly      Discharge instructions      Comments:   Resume previous activity level as tolerated May shower and shampoo   No wound care          Medication List     As of 08/23/2012  7:33 AM    TAKE these medications         HYDROcodone-acetaminophen 5-325 MG per tablet   Commonly known as: NORCO/VICODIN   Take 1-2 tablets by mouth every 4 (four) hours as needed.           Follow-up Information    Follow up with HOXWORTH,BENJAMIN T, MD. In 3 weeks.   Contact information:   [address] [place] [address] [phone]          Signed: [name] 08/23/2012, 7:33 AM

## 2012-08-23 NOTE — Progress Notes (Signed)
Spoke with Dr. Daphine Deutscher. States due to nausea feels prudent to stay in hospital today. Spoke with patient. She agrees to stay.

## 2012-08-24 ENCOUNTER — Encounter (HOSPITAL_COMMUNITY): Payer: Self-pay | Admitting: General Surgery

## 2012-08-24 NOTE — Care Management Note (Signed)
    Page 1 of 1   08/24/2012     10:33:21 AM   CARE MANAGEMENT NOTE 08/24/2012  Patient:  [facility]   Account Number:  [id number]  Date Initiated:  08/24/2012  Documentation initiated by:  Lorenda Ishihara  Subjective/Objective Assessment:     Action/Plan:   Anticipated DC Date:  08/24/2012   Anticipated DC Plan:  HOME/SELF CARE         Choice offered to / List presented to:             Status of service:  Completed, signed off Medicare Important Message given?   (If response is "NO", the following Medicare IM given date fields will be blank) Date Medicare IM given:   Date Additional Medicare IM given:    Discharge Disposition:  HOME/SELF CARE  Per UR Regulation:  Reviewed for med. necessity/level of care/duration of stay  If discussed at Long Length of Stay Meetings, dates discussed:    Comments:

## 2012-09-11 ENCOUNTER — Encounter (HOSPITAL_COMMUNITY): Payer: Self-pay | Admitting: Emergency Medicine

## 2012-09-11 ENCOUNTER — Emergency Department (INDEPENDENT_AMBULATORY_CARE_PROVIDER_SITE_OTHER)
Admission: EM | Admit: 2012-09-11 | Discharge: 2012-09-11 | Disposition: A | Discharge status: Nursing Home (Includes ICF) | Payer: Self-pay | Source: Home / Self Care | Attending: Family Medicine | Admitting: Family Medicine

## 2012-09-11 ENCOUNTER — Emergency Department (HOSPITAL_COMMUNITY)
Admission: EM | Admit: 2012-09-11 | Discharge: 2012-09-11 | Discharge status: Against Medical Advice | Payer: Self-pay | Attending: Emergency Medicine | Admitting: Emergency Medicine

## 2012-09-11 ENCOUNTER — Encounter (HOSPITAL_COMMUNITY): Payer: Self-pay | Admitting: *Deleted

## 2012-09-11 DIAGNOSIS — Z9889 Other specified postprocedural states: Secondary | ICD-10-CM | POA: Insufficient documentation

## 2012-09-11 DIAGNOSIS — R109 Unspecified abdominal pain: Secondary | ICD-10-CM

## 2012-09-11 LAB — CBC WITH DIFFERENTIAL/PLATELET
Basophils Relative: 0 % (ref 0–1)
Eosinophils Absolute: 0.2 10*3/uL (ref 0.0–0.7)
HCT: 38 % (ref 36.0–46.0)
Hemoglobin: 12.7 g/dL (ref 12.0–15.0)
MCH: 26.3 pg (ref 26.0–34.0)
MCHC: 33.4 g/dL (ref 30.0–36.0)
Monocytes Absolute: 0.2 10*3/uL (ref 0.1–1.0)
Monocytes Relative: 5 % (ref 3–12)
Neutrophils Relative %: 31 % — ABNORMAL LOW (ref 43–77)
RDW: 12.9 % (ref 11.5–15.5)

## 2012-09-11 LAB — BASIC METABOLIC PANEL
BUN: 12 mg/dL (ref 6–23)
Calcium: 9.1 mg/dL (ref 8.4–10.5)
Creatinine, Ser: 0.5 mg/dL (ref 0.50–1.10)
GFR calc Af Amer: >90 mL/min (ref >90)
GFR calc non Af Amer: >90 mL/min (ref >90)

## 2012-09-11 LAB — URINALYSIS, ROUTINE W REFLEX MICROSCOPIC
Ketones, ur: 15 mg/dL — AB
Nitrite: NEGATIVE
Protein, ur: NEGATIVE mg/dL
Urobilinogen, UA: 0.2 mg/dL (ref 0.0–1.0)

## 2012-09-11 NOTE — ED Notes (Signed)
Called patient in waiting room to bring back to exam room x2; no answer.  Will try again.

## 2012-09-11 NOTE — ED Notes (Signed)
Pt showed up at [facility]

## 2012-09-11 NOTE — ED Notes (Signed)
Reports abd pain for two/three days now.  Patient did get appendix removed three/four weeks ago and was not able to follow up because she is unable to make copay.

## 2012-09-11 NOTE — ED Provider Notes (Signed)
History     CSN: [id number]  Arrival date & time 09/11/12  1426   First MD Initiated Contact with Patient 09/11/12 1428      Chief Complaint  Patient presents with  . Abdominal Pain    (Consider location/radiation/quality/duration/timing/severity/associated sxs/prior treatment) HPI Comments: 21 year old female with a recent history of laparoscopic appendectomy on November 22. She did not keep her surgery hospital followup appointment due to lack of insurance. Comes complaining of abdominal pain for 2 days. Patient taken hydrocodone without significant relief. Patient points to epigastric and right lower quadrant when asked to localize pain. Denies dysuria or hematuria. She reports decreased appetite and has not been able to drink or eat anything in the last 24 hours. Denies vomiting. Last bowel movement was earlier today and she reports it was normal. No diarrhea. Denies fever or chills. Patient stated her pain improved after her surgery and restarted in the last 2 days. States that she had been eating and drinking fluids fine before 2 days ago.   Past Medical History  Diagnosis Date  . No pertinent past medical history     Past Surgical History  Procedure Date  . No past surgeries   . Laparoscopic appendectomy 08/21/2012    Procedure: APPENDECTOMY LAPAROSCOPIC;  Surgeon: Mariella Saa, MD;  Location: [facility];  Service: General;  Laterality: N/A;    History reviewed. No pertinent family history.  History  Substance Use Topics  . Smoking status: Never Smoker   . Smokeless tobacco: Never Used  . Alcohol Use: No    OB History    Grav Para Term Preterm Abortions TAB SAB Ect Mult Living   1 1 1              Review of Systems  Constitutional: Positive for appetite change. Negative for fever and chills.  Gastrointestinal: Positive for abdominal pain. Negative for vomiting, diarrhea, constipation and blood in stool.  Neurological: Negative for dizziness and headaches.     Allergies  Review of patient's allergies indicates no known allergies.  Home Medications   Current Outpatient Rx  Name  Route  Sig  Dispense  Refill  . HYDROCODONE-ACETAMINOPHEN 5-325 MG PO TABS   Oral   Take 1-2 tablets by mouth every 4 (four) hours as needed.   30 tablet   0     BP 100/65  Pulse 90  Temp 97 F (36.1 C) (Oral)  Resp 16  SpO2 99%  LMP 08/07/2012  Physical Exam  Nursing note and vitals reviewed. Constitutional: She is oriented to person, place, and time. She appears well-developed and well-nourished. No distress.  HENT:  Head: Normocephalic and atraumatic.  Eyes: No scleral icterus.  Neck: No thyromegaly present.  Cardiovascular: Normal heart sounds.   Pulmonary/Chest: Breath sounds normal.  Abdominal: Soft. Bowel sounds are normal. There is tenderness.       Tenderness to palpation with grimacing and voluntary guarding in epigastric, right flank and right lower quadrant. No rebound.  Neurological: She is alert and oriented to person, place, and time.  Skin: She is not diaphoretic.       Well healed small laparoscopic incisions in abdomen.    ED Course  Procedures (including critical care time)  Labs Reviewed - No data to display No results found.   1. Abdominal pain       MDM  21 year old female with a recent history of laparoscopic appendectomy on November 22.  Comes complaining of abdominal pain for 2 days. On exam: She  is afebrile, looks dehydrated with dry ulcerated lips, vital signs stable with a heart rate in 90s. Bowel sounds are present. Abdomen is tender in epigastric, right flank and right lower quadrant. Impress voluntary guarding at no rebound. Decided to transfer to the emergency department for further evaluation and management.        Sharin Grave, MD 09/12/12 [phone]

## 2012-09-11 NOTE — ED Notes (Signed)
Pt sent from [facility] c/o lower abd pain and epigastric pain; pt had recent laproscopic appy; pt sent for further eval

## 2012-09-11 NOTE — ED Notes (Signed)
No answer 3rd time called

## 2012-09-11 NOTE — ED Notes (Signed)
No answer

## 2013-09-30 NOTE — L&D Delivery Note (Signed)
Delivery Note At 2:30 AM a viable and healthy female was delivered via Vaginal, Spontaneous Delivery (Presentation: Right Occiput Anterior).  APGAR: 6, 9; weight  .   Placenta status: Intact, Spontaneous.  Cord: 3 vessels with the following complications: None.   Anesthesia: None  Episiotomy: None Lacerations: None Suture Repair: N/A Est. Blood Loss (mL): 200  Mom to postpartum.  Baby to Couplet care / Skin to Skin.  Benjamin Stainhompson, Kaitlyn Franko L, MD 09/20/2014, 2:55 AM

## 2014-02-24 ENCOUNTER — Other Ambulatory Visit: Payer: Self-pay

## 2014-02-24 LAB — OB RESULTS CONSOLE GC/CHLAMYDIA
CHLAMYDIA, DNA PROBE: NEGATIVE
Chlamydia: NEGATIVE
GC PROBE AMP, GENITAL: NEGATIVE
Gonorrhea: NEGATIVE

## 2014-02-24 LAB — OB RESULTS CONSOLE HIV ANTIBODY (ROUTINE TESTING)
HIV: NONREACTIVE
HIV: NONREACTIVE

## 2014-02-24 LAB — OB RESULTS CONSOLE ABO/RH: RH Type: POSITIVE

## 2014-02-24 LAB — OB RESULTS CONSOLE RUBELLA ANTIBODY, IGM: RUBELLA: IMMUNE

## 2014-02-24 LAB — OB RESULTS CONSOLE ANTIBODY SCREEN: ANTIBODY SCREEN: NEGATIVE

## 2014-02-24 LAB — OB RESULTS CONSOLE RPR
RPR: NONREACTIVE
RPR: NONREACTIVE

## 2014-02-24 LAB — OB RESULTS CONSOLE HEPATITIS B SURFACE ANTIGEN: Hepatitis B Surface Ag: NEGATIVE

## 2014-03-01 ENCOUNTER — Other Ambulatory Visit (HOSPITAL_COMMUNITY): Payer: Self-pay | Admitting: Nurse Practitioner

## 2014-03-01 DIAGNOSIS — Z3682 Encounter for antenatal screening for nuchal translucency: Secondary | ICD-10-CM

## 2014-03-03 ENCOUNTER — Ambulatory Visit (HOSPITAL_COMMUNITY)
Admission: RE | Admit: 2014-03-03 | Discharge: 2014-03-03 | Disposition: A | Discharge status: Home / Self Care | Payer: Medicaid Other | Source: Ambulatory Visit | Attending: Nurse Practitioner | Admitting: Nurse Practitioner

## 2014-03-03 ENCOUNTER — Encounter (HOSPITAL_COMMUNITY): Payer: Self-pay

## 2014-03-03 DIAGNOSIS — Z36 Encounter for antenatal screening of mother: Secondary | ICD-10-CM | POA: Insufficient documentation

## 2014-03-03 DIAGNOSIS — Z3682 Encounter for antenatal screening for nuchal translucency: Secondary | ICD-10-CM

## 2014-03-24 ENCOUNTER — Other Ambulatory Visit (HOSPITAL_COMMUNITY): Payer: Self-pay | Admitting: Nurse Practitioner

## 2014-03-24 DIAGNOSIS — Z3689 Encounter for other specified antenatal screening: Secondary | ICD-10-CM

## 2014-04-21 ENCOUNTER — Ambulatory Visit (HOSPITAL_COMMUNITY)
Admission: RE | Admit: 2014-04-21 | Discharge: 2014-04-21 | Disposition: A | Discharge status: Home / Self Care | Payer: Medicaid Other | Source: Ambulatory Visit | Attending: Nurse Practitioner | Admitting: Nurse Practitioner

## 2014-04-21 DIAGNOSIS — Z3689 Encounter for other specified antenatal screening: Secondary | ICD-10-CM | POA: Diagnosis present

## 2014-08-01 ENCOUNTER — Encounter (HOSPITAL_COMMUNITY): Payer: Self-pay

## 2014-08-15 LAB — OB RESULTS CONSOLE GBS: STREP GROUP B AG: POSITIVE

## 2014-09-12 ENCOUNTER — Other Ambulatory Visit (HOSPITAL_COMMUNITY): Payer: Self-pay | Admitting: Urology

## 2014-09-12 DIAGNOSIS — O480 Post-term pregnancy: Secondary | ICD-10-CM

## 2014-09-15 ENCOUNTER — Other Ambulatory Visit (HOSPITAL_COMMUNITY): Payer: Self-pay | Admitting: Urology

## 2014-09-15 ENCOUNTER — Ambulatory Visit (HOSPITAL_COMMUNITY)
Admission: RE | Admit: 2014-09-15 | Discharge: 2014-09-15 | Disposition: A | Discharge status: Home / Self Care | Payer: Medicaid Other | Source: Ambulatory Visit | Attending: Urology | Admitting: Urology

## 2014-09-15 DIAGNOSIS — O480 Post-term pregnancy: Secondary | ICD-10-CM | POA: Diagnosis not present

## 2014-09-16 DIAGNOSIS — O480 Post-term pregnancy: Secondary | ICD-10-CM | POA: Insufficient documentation

## 2014-09-16 DIAGNOSIS — Z3A40 40 weeks gestation of pregnancy: Secondary | ICD-10-CM | POA: Insufficient documentation

## 2014-09-19 ENCOUNTER — Inpatient Hospital Stay (HOSPITAL_COMMUNITY)
Admission: AD | Admit: 2014-09-19 | Discharge: 2014-09-22 | DRG: 775 | Disposition: A | Discharge status: Home / Self Care | Payer: Medicaid Other | Source: Ambulatory Visit | Attending: Obstetrics & Gynecology | Admitting: Obstetrics & Gynecology

## 2014-09-19 ENCOUNTER — Inpatient Hospital Stay (HOSPITAL_COMMUNITY): Payer: Medicaid Other

## 2014-09-19 ENCOUNTER — Encounter (HOSPITAL_COMMUNITY): Payer: Self-pay

## 2014-09-19 DIAGNOSIS — R079 Chest pain, unspecified: Secondary | ICD-10-CM | POA: Diagnosis present

## 2014-09-19 DIAGNOSIS — O99824 Streptococcus B carrier state complicating childbirth: Secondary | ICD-10-CM | POA: Diagnosis present

## 2014-09-19 DIAGNOSIS — R1012 Left upper quadrant pain: Secondary | ICD-10-CM

## 2014-09-19 DIAGNOSIS — R0602 Shortness of breath: Secondary | ICD-10-CM

## 2014-09-19 DIAGNOSIS — O480 Post-term pregnancy: Principal | ICD-10-CM | POA: Diagnosis present

## 2014-09-19 DIAGNOSIS — Z3A41 41 weeks gestation of pregnancy: Secondary | ICD-10-CM | POA: Diagnosis present

## 2014-09-19 LAB — COMPREHENSIVE METABOLIC PANEL
ALT: <5 U/L (ref 0–35)
ANION GAP: 15 (ref 5–15)
AST: 14 U/L (ref 0–37)
Albumin: 2.8 g/dL — ABNORMAL LOW (ref 3.5–5.2)
Alkaline Phosphatase: 314 U/L — ABNORMAL HIGH (ref 39–117)
BUN: 6 mg/dL (ref 6–23)
CALCIUM: 8.7 mg/dL (ref 8.4–10.5)
CO2: 21 mEq/L (ref 19–32)
Chloride: 103 mEq/L (ref 96–112)
Creatinine, Ser: 0.44 mg/dL — ABNORMAL LOW (ref 0.50–1.10)
GFR calc non Af Amer: >90 mL/min (ref >90)
GLUCOSE: 97 mg/dL (ref 70–99)
Potassium: 3.8 mEq/L (ref 3.7–5.3)
Sodium: 139 mEq/L (ref 137–147)
Total Bilirubin: 0.3 mg/dL (ref 0.3–1.2)
Total Protein: 6.7 g/dL (ref 6.0–8.3)

## 2014-09-19 LAB — CBC
HEMATOCRIT: 30 % — AB (ref 36.0–46.0)
Hemoglobin: 9.3 g/dL — ABNORMAL LOW (ref 12.0–15.0)
MCH: 21.8 pg — AB (ref 26.0–34.0)
MCHC: 31 g/dL (ref 30.0–36.0)
MCV: 70.4 fL — AB (ref 78.0–100.0)
Platelets: 178 10*3/uL (ref 150–400)
RBC: 4.26 MIL/uL (ref 3.87–5.11)
RDW: 15.8 % — AB (ref 11.5–15.5)
WBC: 9.8 10*3/uL (ref 4.0–10.5)

## 2014-09-19 LAB — RAPID STREP SCREEN (MED CTR MEBANE ONLY): Streptococcus, Group A Screen (Direct): NEGATIVE

## 2014-09-19 LAB — TYPE AND SCREEN
ABO/RH(D): A POS
Antibody Screen: NEGATIVE

## 2014-09-19 LAB — ABO/RH: ABO/RH(D): A POS

## 2014-09-19 MED ORDER — PENICILLIN G POTASSIUM 5000000 UNITS IJ SOLR
2.5000 10*6.[IU] | INTRAVENOUS | Status: DC
  Administered 2014-09-20: 2.5 10*6.[IU] via INTRAVENOUS
  Filled 2014-09-19 (×4): qty 2.5

## 2014-09-19 MED ORDER — OXYCODONE-ACETAMINOPHEN 5-325 MG PO TABS: 1.0000 | ORAL_TABLET | ORAL | Status: DC | PRN

## 2014-09-19 MED ORDER — LIDOCAINE HCL (PF) 1 % IJ SOLN
30.0000 mL | INTRAMUSCULAR | Status: DC | PRN
  Filled 2014-09-19: qty 30

## 2014-09-19 MED ORDER — OXYCODONE-ACETAMINOPHEN 5-325 MG PO TABS
2.0000 | ORAL_TABLET | Freq: Once | ORAL | Status: AC
  Administered 2014-09-19: 2 via ORAL
  Filled 2014-09-19: qty 2

## 2014-09-19 MED ORDER — OXYCODONE-ACETAMINOPHEN 5-325 MG PO TABS: 2.0000 | ORAL_TABLET | ORAL | Status: DC | PRN

## 2014-09-19 MED ORDER — OXYTOCIN 40 UNITS IN LACTATED RINGERS INFUSION - SIMPLE MED
1.0000 m[IU]/min | INTRAVENOUS | Status: DC
  Administered 2014-09-19: 2 m[IU]/min via INTRAVENOUS

## 2014-09-19 MED ORDER — DEXTROSE 5 % IV SOLN
2.5000 10*6.[IU] | INTRAVENOUS | Status: DC
  Filled 2014-09-19 (×4): qty 2.5

## 2014-09-19 MED ORDER — PENICILLIN G POTASSIUM 5000000 UNITS IJ SOLR
5.0000 10*6.[IU] | Freq: Once | INTRAVENOUS | Status: AC | PRN
  Administered 2014-09-19: 5 10*6.[IU] via INTRAVENOUS
  Filled 2014-09-19: qty 5

## 2014-09-19 MED ORDER — PENICILLIN G POTASSIUM 5000000 UNITS IJ SOLR
5.0000 10*6.[IU] | Freq: Once | INTRAVENOUS | Status: AC
  Filled 2014-09-19: qty 5

## 2014-09-19 MED ORDER — MISOPROSTOL 25 MCG QUARTER TABLET
25.0000 ug | ORAL_TABLET | ORAL | Status: DC
Start: 2014-09-19 — End: 2014-09-20
  Administered 2014-09-19: 25 ug via VAGINAL
  Filled 2014-09-19: qty 0.25
  Filled 2014-09-19 (×2): qty 1

## 2014-09-19 MED ORDER — ACETAMINOPHEN 325 MG PO TABS
650.0000 mg | ORAL_TABLET | ORAL | Status: DC | PRN
Start: 2014-09-19 — End: 2014-09-20

## 2014-09-19 MED ORDER — TERBUTALINE SULFATE 1 MG/ML IJ SOLN
INTRAMUSCULAR | Status: AC
  Administered 2014-09-19: 0.25 mg via SUBCUTANEOUS
  Filled 2014-09-19: qty 1

## 2014-09-19 MED ORDER — CITRIC ACID-SODIUM CITRATE 334-500 MG/5ML PO SOLN: 30.0000 mL | ORAL | Status: DC | PRN

## 2014-09-19 MED ORDER — DEXTROSE 5 % IV SOLN
5.0000 10*6.[IU] | Freq: Once | INTRAVENOUS | Status: DC
  Filled 2014-09-19: qty 5

## 2014-09-19 MED ORDER — PENICILLIN G POTASSIUM 5000000 UNITS IJ SOLR
2.5000 10*6.[IU] | INTRAVENOUS | Status: DC | PRN
  Filled 2014-09-19: qty 2.5

## 2014-09-19 MED ORDER — ONDANSETRON HCL 4 MG/2ML IJ SOLN: 4.0000 mg | Freq: Four times a day (QID) | INTRAMUSCULAR | Status: DC | PRN

## 2014-09-19 MED ORDER — LACTATED RINGERS IV SOLN
INTRAVENOUS | Status: DC
  Administered 2014-09-19 (×2): via INTRAVENOUS

## 2014-09-19 MED ORDER — FENTANYL CITRATE 0.05 MG/ML IJ SOLN
100.0000 ug | INTRAMUSCULAR | Status: DC | PRN
  Administered 2014-09-20 (×2): 100 ug via INTRAVENOUS
  Filled 2014-09-19 (×2): qty 2

## 2014-09-19 MED ORDER — OXYTOCIN BOLUS FROM INFUSION: 500.0000 mL | INTRAVENOUS | Status: DC

## 2014-09-19 MED ORDER — LACTATED RINGERS IV SOLN: 500.0000 mL | INTRAVENOUS | Status: DC | PRN

## 2014-09-19 MED ORDER — OXYTOCIN 40 UNITS IN LACTATED RINGERS INFUSION - SIMPLE MED
62.5000 mL/h | INTRAVENOUS | Status: DC
  Filled 2014-09-19: qty 1000

## 2014-09-19 MED ORDER — TERBUTALINE SULFATE 1 MG/ML IJ SOLN
0.2500 mg | Freq: Once | INTRAMUSCULAR | Status: AC
  Administered 2014-09-19: 0.25 mg via SUBCUTANEOUS

## 2014-09-19 MED ORDER — TERBUTALINE SULFATE 1 MG/ML IJ SOLN: 0.2500 mg | Freq: Once | INTRAMUSCULAR | Status: AC | PRN

## 2014-09-19 MED ORDER — IOHEXOL 350 MG/ML SOLN
100.0000 mL | Freq: Once | INTRAVENOUS | Status: AC | PRN
  Administered 2014-09-19: 100 mL via INTRAVENOUS

## 2014-09-19 NOTE — H&P (Signed)
LABOR ADMISSION HISTORY AND PHYSICAL  Valerie [name] is a 23 y.o. female G2P1001 with IUP at 3274w0d by 12 week ultrasound sent from clinic for induction of labor after non reactive NST in the setting of post dates.   +Fetal movement. +Irregular contractions. Denies loss of fluid, vaginal bleeding.   At the time of initial evaluation, she reports severe headache, sore throat, cough, left sided chest/abdominal pain, shortness of breath, and overall malaise.   Denies fevers, chills, nausea, vomiting, diarrhea.   She desires an epidural for labor pain control. She plans on breast and bottle feeding.  Dating: By 12 week ultrasound --->  Estimated Date of Delivery: 09/12/14  Prenatal History/Complications: - Anemia - GBS positive  Past Medical History: Past Medical History  Diagnosis Date  . No pertinent past medical history     Past Surgical History: Past Surgical History  Procedure Laterality Date  . No past surgeries    . Laparoscopic appendectomy  08/21/2012    Procedure: APPENDECTOMY LAPAROSCOPIC;  Surgeon: Mariella SaaBenjamin T Hoxworth, MD;  Location: [facility];  Service: General;  Laterality: N/A;    Obstetrical History: OB History    Gravida Para Term Preterm AB TAB SAB Ectopic Multiple Living   2 1 1  0 0 0 0 0 0 1      Gynecological History: Noncontributory  Social History: History   Social History  . Marital Status: Married    Spouse Name: N/A    Number of Children: N/A  . Years of Education: N/A   Social History Main Topics  . Smoking status: Never Smoker   . Smokeless tobacco: Never Used  . Alcohol Use: No  . Drug Use: No  . Sexual Activity: Yes   Other Topics Concern  . None   Social History Narrative    Family History: History reviewed. No pertinent family history.  Allergies: No Known Allergies  Prescriptions prior to admission  Medication Sig Dispense Refill Last Dose  . Acetaminophen (TYLENOL PO) Take by mouth.     Marland Kitchen. HYDROcodone-acetaminophen  (NORCO/VICODIN) 5-325 MG per tablet Take 1-2 tablets by mouth every 4 (four) hours as needed. 30 tablet 0   . Prenatal Vit w/Fe-Methylfol-FA (PNV PO) Take by mouth.        Review of Systems   All systems reviewed and negative except as stated in HPI  Blood pressure 97/44, pulse 96, temperature 98 F (36.7 C), temperature source Oral, resp. rate 20, height 5\' 3"  (1.6 m), weight 137 lb (62.143 kg), last menstrual period 11/28/2013, SpO2 100 %. General appearance: alert, cooperative, mild distress and pale Lungs: mildly increased work of breathing, tachypneic, no accessory muscle use, clear to auscultation bilaterally Heart: tachycardic, no murmurs/rubs/gallops  Abdomen: soft, left upper quadrant tender to palpation, bowel sounds normal Pelvic: 3/50/-2 Extremities: Homans sign is negative, no sign of DVT Neuro: CN II-XII grossly intact, strength 5/5 throughout, sensation intact, DTRs 2+ and equal, normal gait Presentation: cephalic Fetal monitoringBaseline: 145 bpm, Variability: Good {> 6 bpm), Accelerations: Reactive and Decelerations: Absent Uterine activityUterine irritability  Dilation: 3 Effacement (%): 50 Station: -2 Exam by:: McEachron   Prenatal labs: ABO, Rh: --/--/A POS (12/21 1255) Antibody: NEG (12/21 1255) Rubella:   RPR: Nonreactive, Nonreactive (05/28 0000)  HBsAg: Negative (05/28 0000)  HIV: Non-reactive, Non-reactive (05/28 0000)  GBS: Positive (11/16 0000)  1 hr Glucola 85 Genetic screening  Normal Anatomy US WNL   Prenatal Transfer Tool  Maternal Diabetes: No Genetic Screening: Normal Maternal Ultrasounds/Referrals: Normal Fetal Ultrasounds  or other Referrals:  None Maternal Substance Abuse:  No Significant Maternal Medications:  None Significant Maternal Lab Results: Lab values include: Group B Strep positive     Results for orders placed or performed during the hospital encounter of 09/19/14 (from the past 24 hour(s))  CBC   Collection Time:  09/19/14 12:55 PM  Result Value Ref Range   WBC 9.8 4.0 - 10.5 K/uL   RBC 4.26 3.87 - 5.11 MIL/uL   Hemoglobin 9.3 (L) 12.0 - 15.0 g/dL   HCT 16.130.0 (L) 09.636.0 - 04.546.0 %   MCV 70.4 (L) 78.0 - 100.0 fL   MCH 21.8 (L) 26.0 - 34.0 pg   MCHC 31.0 30.0 - 36.0 g/dL   RDW 40.915.8 (H) 81.111.5 - 91.415.5 %   Platelets 178 150 - 400 K/uL  Type and screen   Collection Time: 09/19/14 12:55 PM  Result Value Ref Range   ABO/RH(D) A POS    Antibody Screen NEG    Sample Expiration 09/22/2014   Comprehensive metabolic panel   Collection Time: 09/19/14  1:08 PM  Result Value Ref Range   Sodium 139 137 - 147 mEq/L   Potassium 3.8 3.7 - 5.3 mEq/L   Chloride 103 96 - 112 mEq/L   CO2 21 19 - 32 mEq/L   Glucose, Bld 97 70 - 99 mg/dL   BUN 6 6 - 23 mg/dL   Creatinine, Ser 7.820.44 (L) 0.50 - 1.10 mg/dL   Calcium 8.7 8.4 - 95.610.5 mg/dL   Total Protein 6.7 6.0 - 8.3 g/dL   Albumin 2.8 (L) 3.5 - 5.2 g/dL   AST 14 0 - 37 U/L   ALT <5 0 - 35 U/L   Alkaline Phosphatase 314 (H) 39 - 117 U/L   Total Bilirubin 0.3 0.3 - 1.2 mg/dL   GFR calc non Af Amer >90 >90 mL/min   GFR calc Af Amer >90 >90 mL/min   Anion gap 15 5 - 15  Rapid strep screen   Collection Time: 09/19/14  2:51 PM  Result Value Ref Range   Streptococcus, Group A Screen (Direct) NEGATIVE NEGATIVE    Patient Active Problem List   Diagnosis Date Noted  . Post-dates pregnancy 09/19/2014  . Post-term pregnancy, 40-42 weeks of gestation   . [redacted] weeks gestation of pregnancy   . Supervision of normal pregnancy 09/27/2011    Assessment: Valerie [name] is a 23 y.o. G2P1001 at 4217w0d here for induction of labor secondary to non reactive strip in the setting of postdates.   #Labor: Induction for post dates. Cytotec placed initially with no change, Foley balloon now in place. Consented for c-section.  #CP/SOB: Etiology remains unclear. Given tachycardia and tachypnea, concern for underlying process. DDx includes infection (viral process, pneumonia) vs PE. CXR  negative for infection. Will obtain CTA to rule out pulmonary embolism.  #Pain: Epidural planned.  #FWB: Mostly Category I with intermittent periods of Category II #ID:  GBS positive, PCN once active #MOF: Breast/Bottle #MOC:Undecided #Circ:  Female, need to ask  William DaltonMcEachern, Trashawn Oquendo 09/19/2014, 7:19 PM

## 2014-09-19 NOTE — Progress Notes (Signed)
Pt speaks some English. Informed of [employer] as resource. Patient desires husband to interpret for her when he is here, and will use [employer] if he is not. Release of responsibility for interpretation form signed.

## 2014-09-20 ENCOUNTER — Encounter (HOSPITAL_COMMUNITY): Payer: Self-pay | Admitting: Obstetrics

## 2014-09-20 DIAGNOSIS — O480 Post-term pregnancy: Secondary | ICD-10-CM

## 2014-09-20 DIAGNOSIS — Z3A41 41 weeks gestation of pregnancy: Secondary | ICD-10-CM

## 2014-09-20 DIAGNOSIS — O99824 Streptococcus B carrier state complicating childbirth: Secondary | ICD-10-CM

## 2014-09-20 LAB — CBC
HCT: 28.1 % — ABNORMAL LOW (ref 36.0–46.0)
HEMOGLOBIN: 9 g/dL — AB (ref 12.0–15.0)
MCH: 22.3 pg — ABNORMAL LOW (ref 26.0–34.0)
MCHC: 32 g/dL (ref 30.0–36.0)
MCV: 69.7 fL — AB (ref 78.0–100.0)
PLATELETS: 185 10*3/uL (ref 150–400)
RBC: 4.03 MIL/uL (ref 3.87–5.11)
RDW: 15.6 % — ABNORMAL HIGH (ref 11.5–15.5)
WBC: 12.3 10*3/uL — ABNORMAL HIGH (ref 4.0–10.5)

## 2014-09-20 LAB — RPR

## 2014-09-20 LAB — HIV ANTIBODY (ROUTINE TESTING W REFLEX): HIV 1&2 Ab, 4th Generation: NONREACTIVE

## 2014-09-20 MED ORDER — WITCH HAZEL-GLYCERIN EX PADS: 1.0000 "application " | MEDICATED_PAD | CUTANEOUS | Status: DC | PRN

## 2014-09-20 MED ORDER — TETANUS-DIPHTH-ACELL PERTUSSIS 5-2.5-18.5 LF-MCG/0.5 IM SUSP: 0.5000 mL | Freq: Once | INTRAMUSCULAR | Status: DC

## 2014-09-20 MED ORDER — BENZOCAINE-MENTHOL 20-0.5 % EX AERO
1.0000 "application " | INHALATION_SPRAY | CUTANEOUS | Status: DC | PRN
  Administered 2014-09-20: 1 via TOPICAL
  Filled 2014-09-20: qty 56

## 2014-09-20 MED ORDER — SIMETHICONE 80 MG PO CHEW
80.0000 mg | CHEWABLE_TABLET | ORAL | Status: DC | PRN
  Administered 2014-09-21: 80 mg via ORAL
  Filled 2014-09-20: qty 1

## 2014-09-20 MED ORDER — ONDANSETRON HCL 4 MG/2ML IJ SOLN
4.0000 mg | INTRAMUSCULAR | Status: DC | PRN
Start: 2014-09-20 — End: 2014-09-22

## 2014-09-20 MED ORDER — ONDANSETRON HCL 4 MG PO TABS: 4.0000 mg | ORAL_TABLET | ORAL | Status: DC | PRN

## 2014-09-20 MED ORDER — PRENATAL MULTIVITAMIN CH
1.0000 | ORAL_TABLET | Freq: Every day | ORAL | Status: DC
  Administered 2014-09-20 – 2014-09-21 (×2): 1 via ORAL
  Filled 2014-09-20 (×2): qty 1

## 2014-09-20 MED ORDER — IBUPROFEN 600 MG PO TABS
600.0000 mg | ORAL_TABLET | Freq: Four times a day (QID) | ORAL | Status: DC
  Administered 2014-09-20 – 2014-09-22 (×9): 600 mg via ORAL
  Filled 2014-09-20 (×9): qty 1

## 2014-09-20 MED ORDER — ZOLPIDEM TARTRATE 5 MG PO TABS: 5.0000 mg | ORAL_TABLET | Freq: Every evening | ORAL | Status: DC | PRN

## 2014-09-20 MED ORDER — SENNOSIDES-DOCUSATE SODIUM 8.6-50 MG PO TABS
2.0000 | ORAL_TABLET | ORAL | Status: DC
  Administered 2014-09-21: 2 via ORAL
  Filled 2014-09-20 (×2): qty 2

## 2014-09-20 MED ORDER — OXYCODONE-ACETAMINOPHEN 5-325 MG PO TABS
2.0000 | ORAL_TABLET | ORAL | Status: DC | PRN
  Administered 2014-09-20: 2 via ORAL
  Filled 2014-09-20: qty 2

## 2014-09-20 MED ORDER — DIPHENHYDRAMINE HCL 25 MG PO CAPS: 25.0000 mg | ORAL_CAPSULE | Freq: Four times a day (QID) | ORAL | Status: DC | PRN

## 2014-09-20 MED ORDER — LANOLIN HYDROUS EX OINT
TOPICAL_OINTMENT | CUTANEOUS | Status: DC | PRN
Start: 2014-09-20 — End: 2014-09-22

## 2014-09-20 MED ORDER — DIBUCAINE 1 % RE OINT: 1.0000 "application " | TOPICAL_OINTMENT | RECTAL | Status: DC | PRN

## 2014-09-20 MED ORDER — OXYCODONE-ACETAMINOPHEN 5-325 MG PO TABS
1.0000 | ORAL_TABLET | ORAL | Status: DC | PRN
  Administered 2014-09-20 – 2014-09-21 (×6): 1 via ORAL
  Filled 2014-09-20 (×6): qty 1

## 2014-09-20 NOTE — Progress Notes (Signed)
UR chart review completed.  

## 2014-09-20 NOTE — Lactation Note (Signed)
This note was copied from the chart of [name]. Lactation Consultation Note  Patient Name: Boy Valerie FothergillZainab [name] QMVHQ'IToday's Date: 09/20/2014 Reason for consult: Initial assessment of this mom and baby at 18 hours pp.  Mom is weak and dizzy and her nurse recommends LC return later when mom is stable.  [facility] brochure (given to family member at bedside who will review with mom later).  LC will follow-up later tonight or tomorrow.  Baby has had multiple feedings of 10-40 minutes and consistent LATCH scores=9 per RN assessment.  Baby has also had multiple stools and first void at less than 24 hours of life.  Maternal Data Formula Feeding for Exclusion: Yes Reason for exclusion: Mother's choice to formula and breast feed on admission Has patient been taught Hand Expression?:  (unable to assess; not documented but baby has LATCH scores=9) Does the patient have breastfeeding experience prior to this delivery?:  (unable to assess at this time; not documented by nursing staff)  Feeding Feeding Type: Breast Fed Length of feed: 30 min  LATCH Score/Interventions              LATCH scores=9 at several feedings since delivery        Lactation Tools Discussed/Used   Provided [employer] [facility] broch[facility] and briefly reviewed with family member; mom will need follow-up later tonight or tomorrow  Consult Status Consult Status: Follow-up Date: 09/21/14 Follow-up type: In-patient    Warrick ParisianBryant, Myliah Medel [facility] 09/20/2014, 8:42 PM

## 2014-09-21 LAB — CULTURE, GROUP A STREP

## 2014-09-21 NOTE — Lactation Note (Signed)
This note was copied from the chart of Valerie [name]. Lactation Consultation Note  Follow up visit done.  Mom currently has baby at breast and baby is nursing actively.  Assisted with positioning baby tummy to tummy and closer.  Explained to mom that correct positioning will allow for a deeper latch and prevent nipple soreness.  Patient's cousin present to assist with interpreting.  Discussed cluster feeding.  Recommended good breast massage during feeding.  Encouraged to call for concerns or assist.  Patient Name: Valerie Tamala FothergillZainab Older ZOXWR'UToday's Date: 09/21/2014 Reason for consult: Follow-up assessment   Maternal Data    Feeding Feeding Type: Breast Fed Length of feed: 45 min  LATCH Score/Interventions Latch: Grasps breast easily, tongue down, lips flanged, rhythmical sucking.  Audible Swallowing: A few with stimulation Intervention(s): Alternate breast massage  Type of Nipple: Everted at rest and after stimulation  Comfort (Breast/Nipple): Soft / non-tender     Hold (Positioning): Assistance needed to correctly position infant at breast and maintain latch. Intervention(s): Breastfeeding basics reviewed;Support Pillows  LATCH Score: 8  Lactation Tools Discussed/Used     Consult Status Consult Status: Follow-up Date: 09/22/14 Follow-up type: In-patient    Huston FoleyMOULDEN, Harutyun Monteverde S 09/21/2014, 3:32 PM

## 2014-09-21 NOTE — Progress Notes (Signed)
Post Partum Day #1 Subjective: no complaints, up ad lib and tolerating PO; feels well; breast and bottlefeeding; contraception not discussed  Objective: Blood pressure 94/50, pulse 59, temperature 97.9 F (36.6 C), temperature source Oral, resp. rate 18, height 5\' 3"  (1.6 m), weight 62.143 kg (137 lb), last menstrual period 11/28/2013, SpO2 100 %, unknown if currently breastfeeding.  Physical Exam:  General: alert, cooperative and no distress  Heart: RRR Lungs: nl effort Lochia: appropriate Uterine Fundus: firm DVT Evaluation: No evidence of DVT seen on physical exam.   Recent Labs  09/19/14 1255 09/20/14 0602  HGB 9.3* 9.0*  HCT 30.0* 28.1*    Assessment/Plan: Plan for discharge tomorrow   LOS: 2 days   Cam HaiSHAW, Yilia Sacca CNM 09/21/2014, 9:32 AM

## 2014-09-22 ENCOUNTER — Inpatient Hospital Stay (HOSPITAL_COMMUNITY): Admission: RE | Admit: 2014-09-22 | Payer: Medicaid Other | Source: Ambulatory Visit

## 2014-09-22 MED ORDER — IBUPROFEN 600 MG PO TABS
600.0000 mg | ORAL_TABLET | Freq: Four times a day (QID) | ORAL | Status: DC
Start: 2014-09-22 — End: 2015-10-09

## 2014-09-22 NOTE — Discharge Summary (Signed)
Obstetric Discharge Summary Reason for Admission: induction of labor Prenatal Procedures: NST Intrapartum Procedures: spontaneous vaginal delivery Postpartum Procedures: none Complications-Operative and Postpartum: none HEMOGLOBIN  Date Value Ref Range Status  09/20/2014 9.0* 12.0 - 15.0 g/dL Final   HCT  Date Value Ref Range Status  09/20/2014 28.1* 36.0 - 46.0 % Final  Hospital Course: LABOR ADMISSION HISTORY AND PHYSICAL  Valerie [name] is a 23 y.o. female G2P1001 with IUP at 3369w0d by 12 week ultrasound sent from clinic for induction of labor after non reactive NST in the setting of post dates.   +Fetal movement. +Irregular contractions. Denies loss of fluid, vaginal bleeding.   At the time of initial evaluation, she reports severe headache, sore throat, cough, left sided chest/abdominal pain, shortness of breath, and overall malaise.   Denies fevers, chills, nausea, vomiting, diarrhea.   She desires an epidural for labor pain control. She plans on breast and bottle feeding. Delivery Note At 2:30 AM a viable and healthy female was delivered via Vaginal, Spontaneous Delivery (Presentation: Right Occiput Anterior). APGAR: 6, 9; weight .  Placenta status: Intact, Spontaneous. Cord: 3 vessels with the following complications: None.   Anesthesia: None  Episiotomy: None Lacerations: None Suture Repair: N/A Est. Blood Loss (mL): 200  Mom to postpartum. Baby to Couplet care / Skin to Skin.  Valerie [name], Valerie L, MD 09/20/2014, 2:55 AM  Has done well postpartum. Breastfeeding well.  Ready for discharge.  Physical Exam:  General: alert, cooperative and no distress Lochia: appropriate Uterine Fundus: firm Incision: healing well DVT Evaluation: No evidence of DVT seen on physical exam.  Discharge Diagnoses: Term Pregnancy-delivered  Discharge Information: Date: 09/22/2014 Activity: unrestricted and pelvic rest Diet: routine Medications: PNV and Ibuprofen Condition:  stable and improved Instructions: refer to practice specific booklet Discharge to: home   Newborn Data: Live born female  Birth Weight: 7 lb 12.3 oz (3525 g) APGAR: 6, 9  Home with mother.  [name]Valerie [name] 09/22/2014, 6:55 AM

## 2014-09-22 NOTE — Discharge Instructions (Signed)

## 2014-09-22 NOTE — Lactation Note (Signed)
This note was copied from the chart of Valerie [name]. Lactation Consultation Note  Patient Name: Valerie Tamala FothergillZainab Lewinski WUJWJ'XToday's Date: 09/22/2014 Reason for consult: Follow-up assessment  Baby is 1655 hours old and is latching at the breast and supplemented after per mom choice. Baby recently fed at 0900 at the breast and supplemented 20 ml . LC discussed supply and demand. Per mom nipples are tender, LC assessed breast tissue with moms permission, and LC noted steady flow of colostrum , breast feeling full and warm. LC highly recommended to mom to breast feed on both breast and if baby is satisfied to hold off on supplementing and especially since the milk is coming in .  LC reviewed sore nipple and engorgement prevention and tx . Instructed on use hand pump and comfort gels. Mother informed of post-discharge support and given phone number to the lactation department, including services for phone call assistance; out-patient appointments; and breastfeeding support group. List of other breastfeeding resources in the community given in the handout. Encouraged mother to call for problems or concerns related to breastfeeding.   Maternal Data    Feeding Feeding Type: Bottle Fed - Formula Length of feed: 30 min  LATCH Score/Interventions                Intervention(s): Breastfeeding basics reviewed     Lactation Tools Discussed/Used Tools: Comfort gels;Pump Breast pump type: Manual   Consult Status Consult Status: Complete Date: 09/22/14    Kathrin Greathouseorio, Shaindel Sweeten Ann 09/22/2014, 9:40 AM

## 2015-02-19 IMAGING — CR DG CHEST 2V
2 series · 2 of 2 positions shown · non-contrast
Comparison: None.

CLINICAL DATA: Difficulty breathing and cough

EXAM:
CHEST  2 VIEW

[view not recorded (1 of 2)]
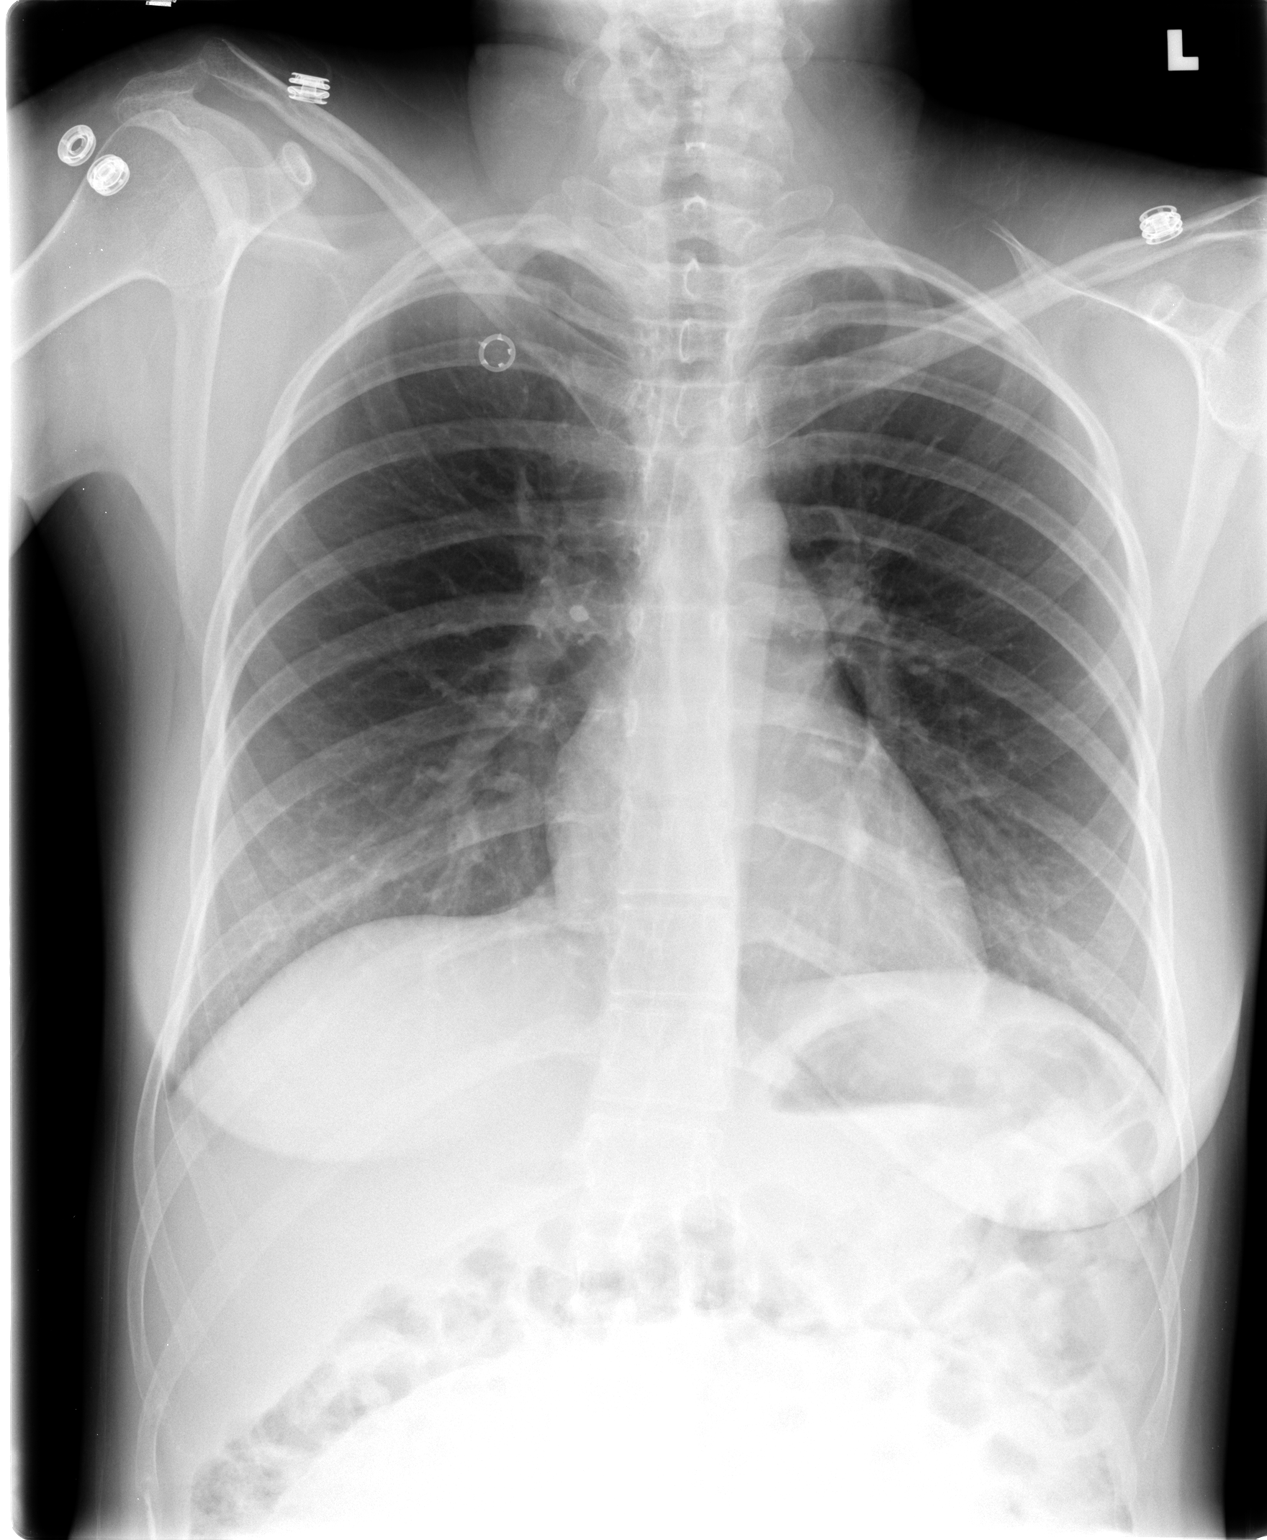

[view not recorded (2 of 2)]
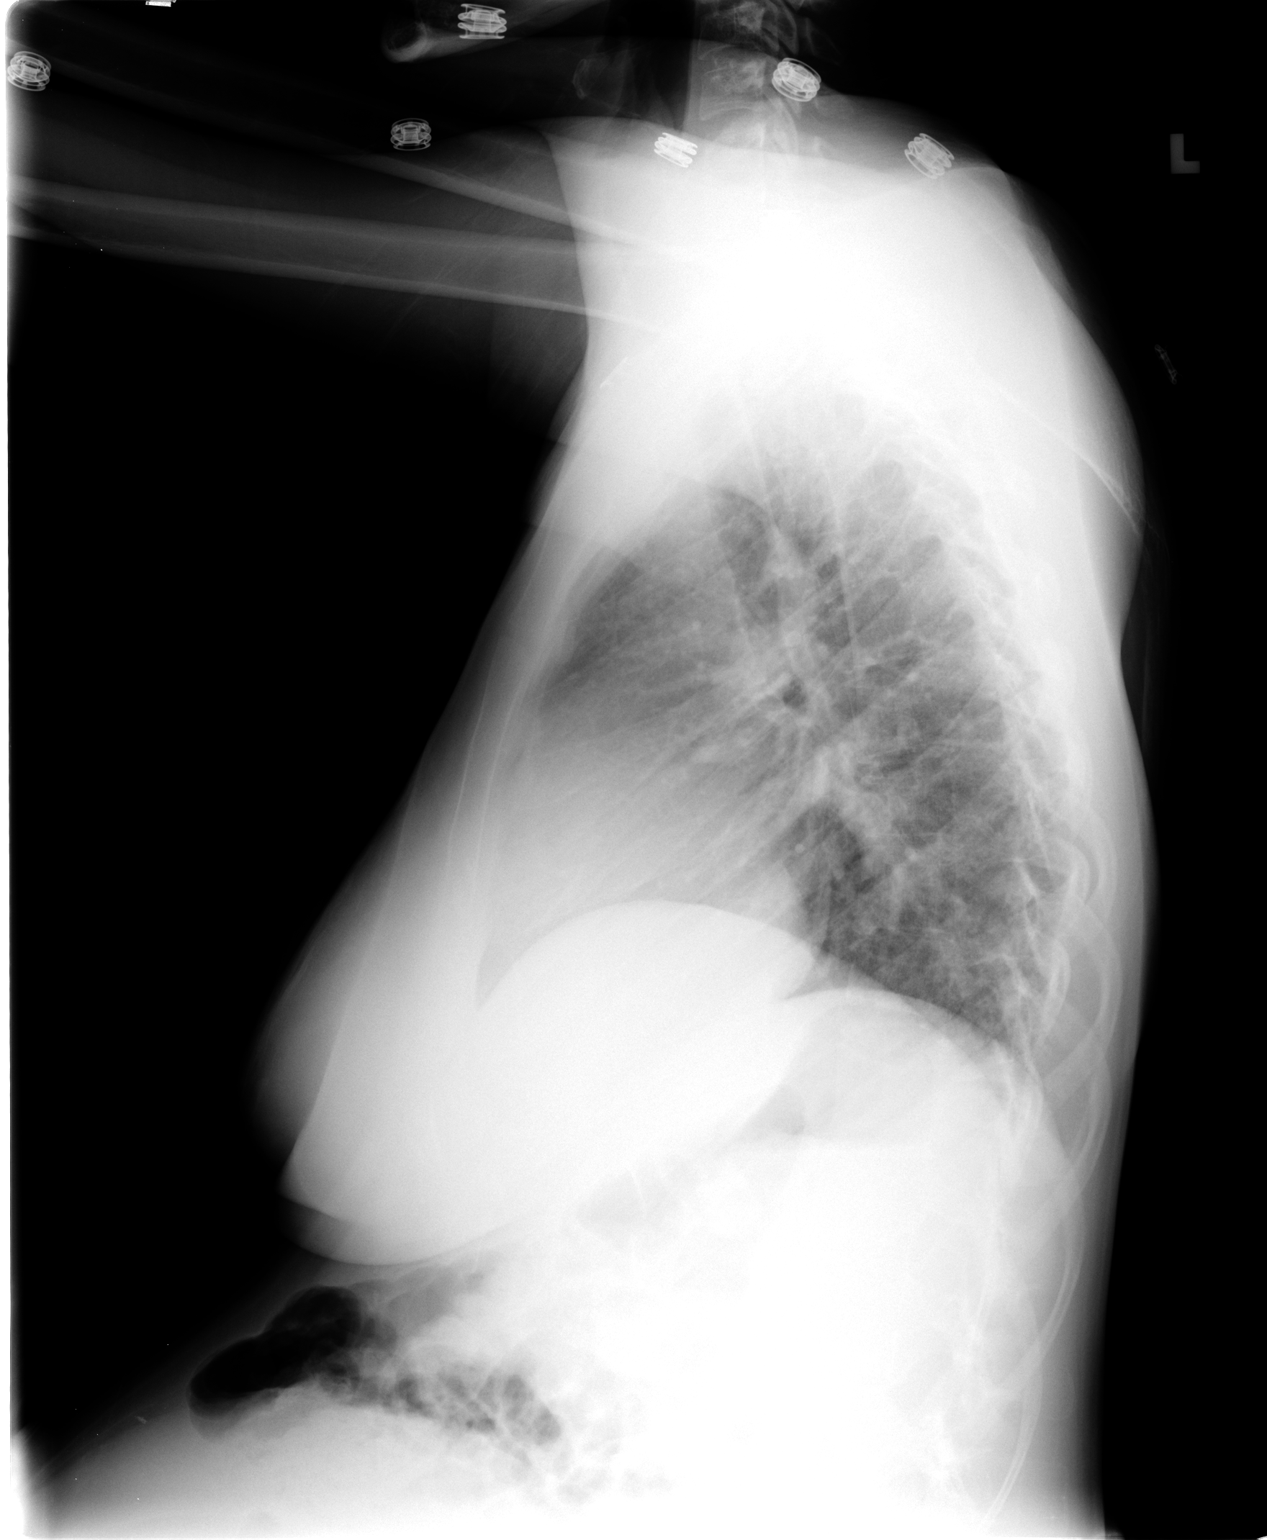

[2 of 2 positions shown; findings below may reference images not displayed]

FINDINGS: Lungs are clear. Heart size and pulmonary vascularity are normal. No
adenopathy. No bone lesions.
IMPRESSION: No edema or consolidation.

## 2015-02-19 IMAGING — CT CT ANGIO CHEST
2 of 6 series · 19 of 36 positions shown · IV contrast (OMNIPAQUE)
Comparison: Prior radiograph from earlier the same day.

EXAM:
CT ANGIOGRAPHY CHEST WITH CONTRAST
TECHNIQUE: Multidetector CT imaging of the chest was performed using the
standard protocol during bolus administration of intravenous
contrast. Multiplanar CT image reconstructions and MIPs were
obtained to evaluate the vascular anatomy.

CONTRAST:  100mL OMNIPAQUE IOHEXOL 350 MG/ML SOLN

[Series 7: pe chest · axial · 0.66mm/px · z∈[-249,-37]mm · 18 of 116 slices shown]
[im 5/116  lung]
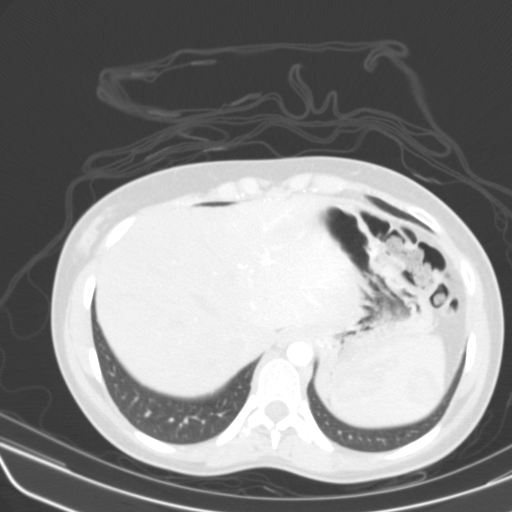
[im 14/116  mediastinal]
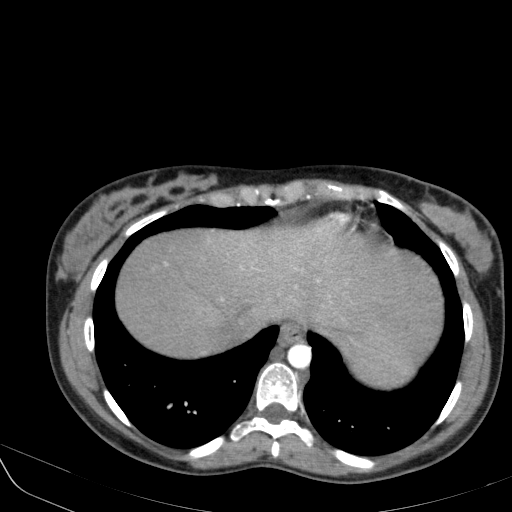
[im 18/116  lung]
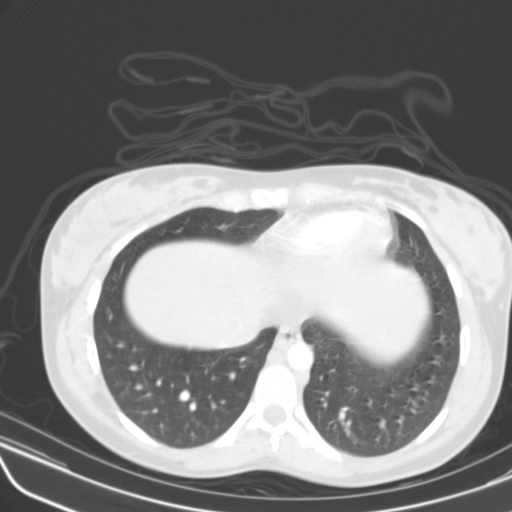
[im 23/116  mediastinal]
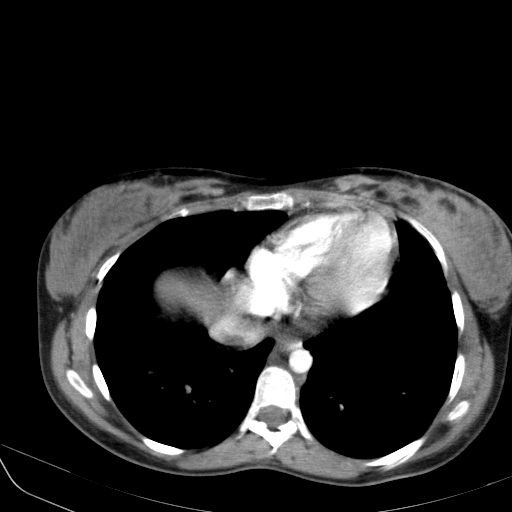
[im 31/116  lung]
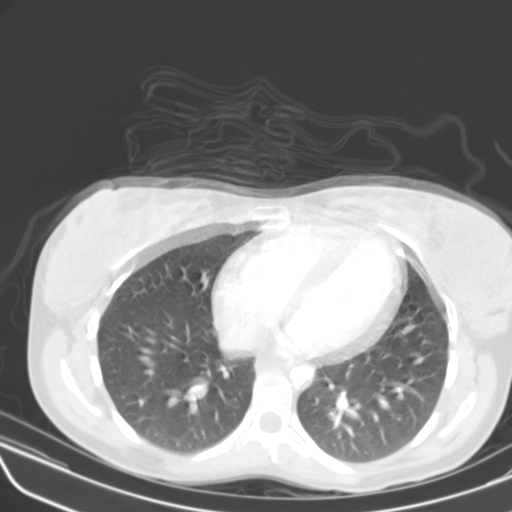
[im 36/116  mediastinal]
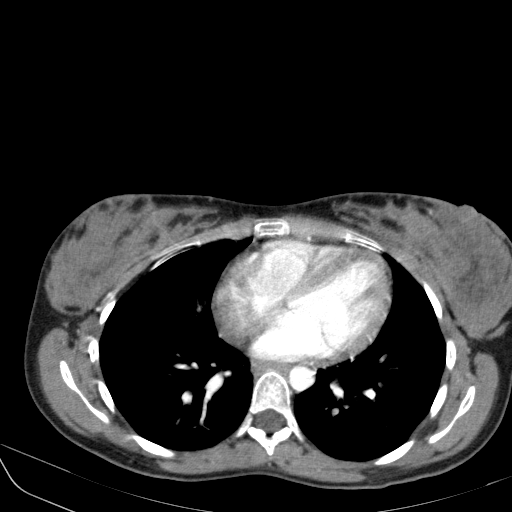
[im 45/116  lung]
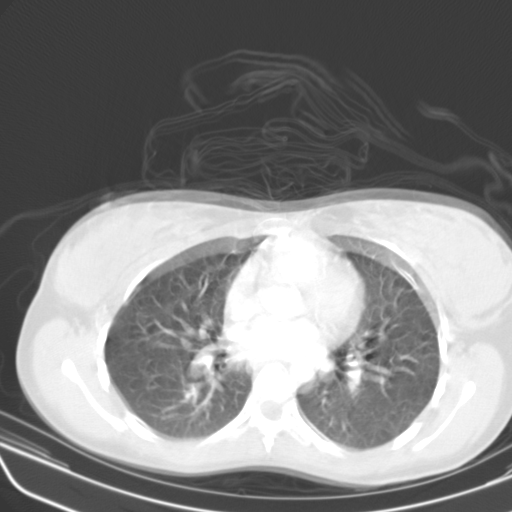
[im 49/116  mediastinal]
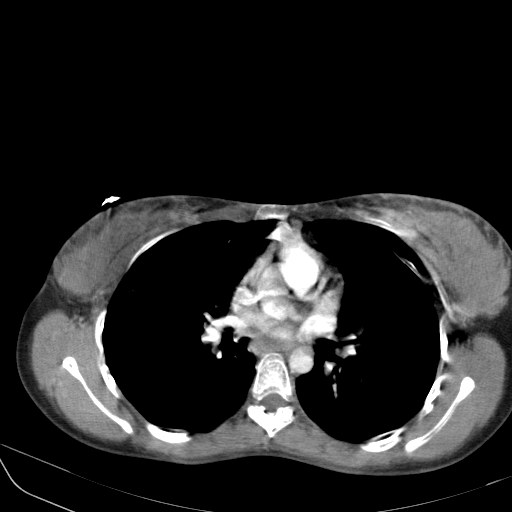
[im 54/116  lung]
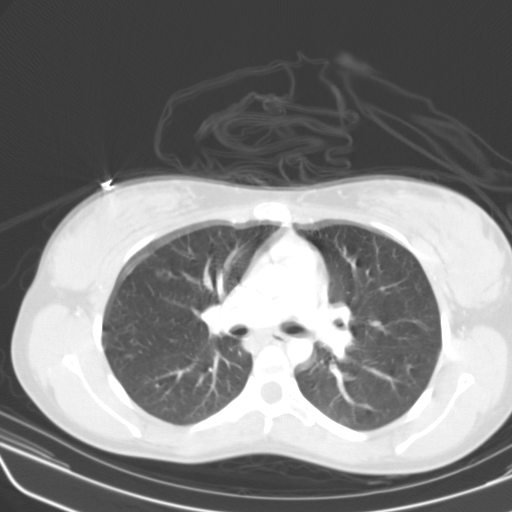
[im 62/116  mediastinal]
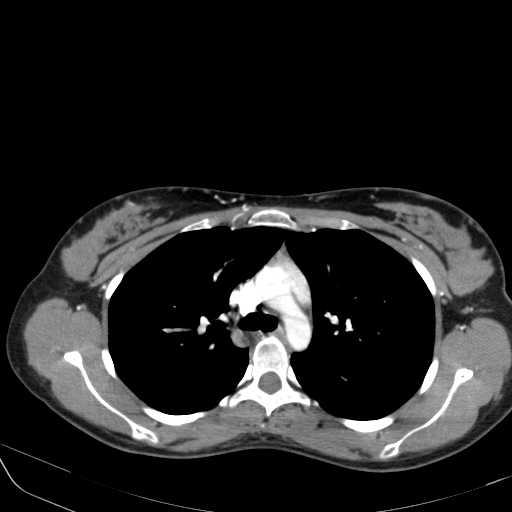
[im 67/116  lung]
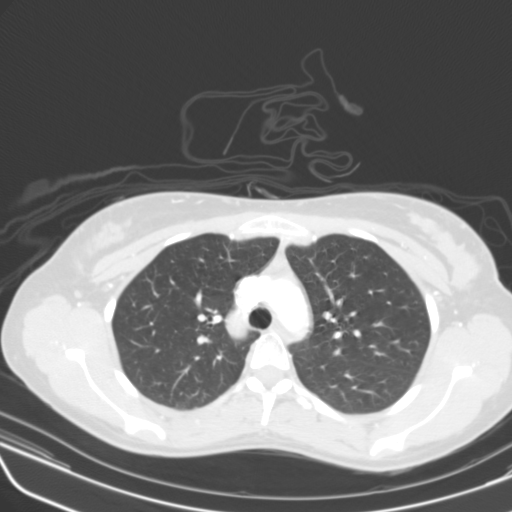
[im 71/116  mediastinal]
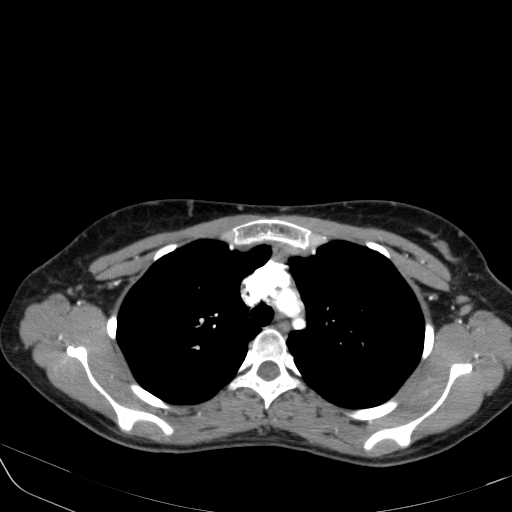
[im 80/116  lung]
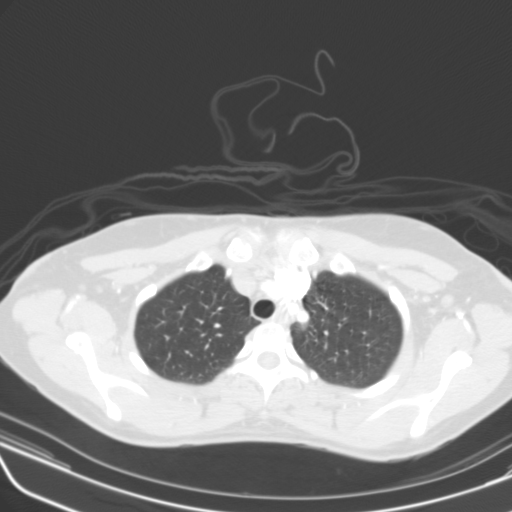
[im 85/116  mediastinal]
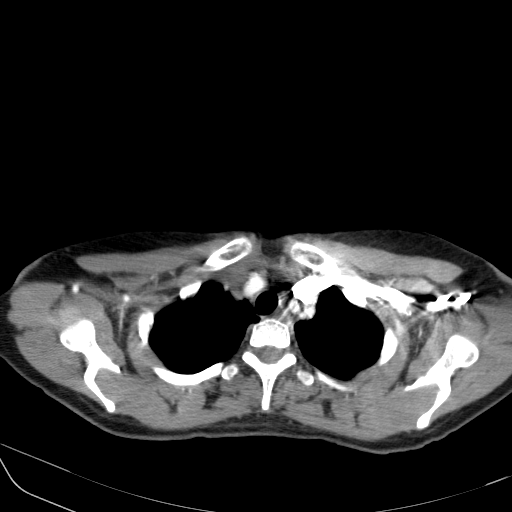
[im 93/116  lung]
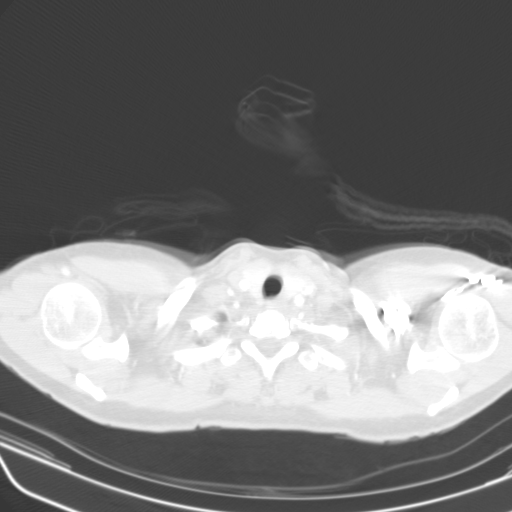
[im 98/116  mediastinal]
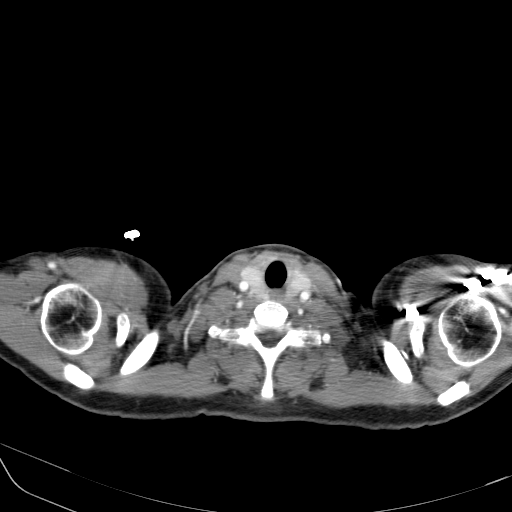
[im 102/116  lung]
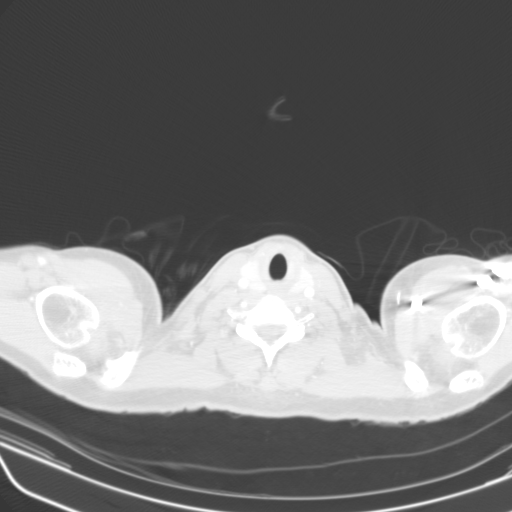
[im 111/116  mediastinal]
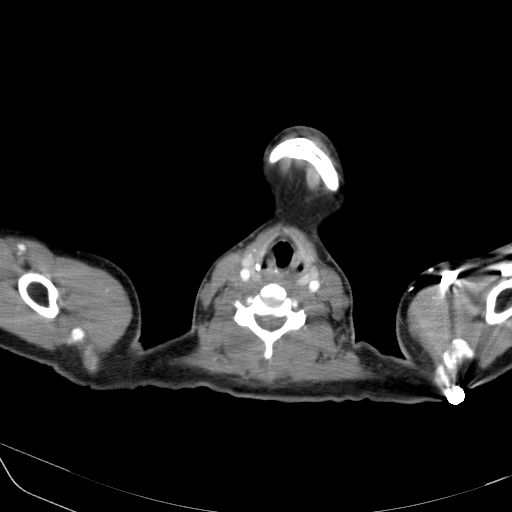

[Series 602: <mpr thick range> · coronal · 0.66mm/px · 1 of 106 slices shown]
[im 53/106  mediastinal]
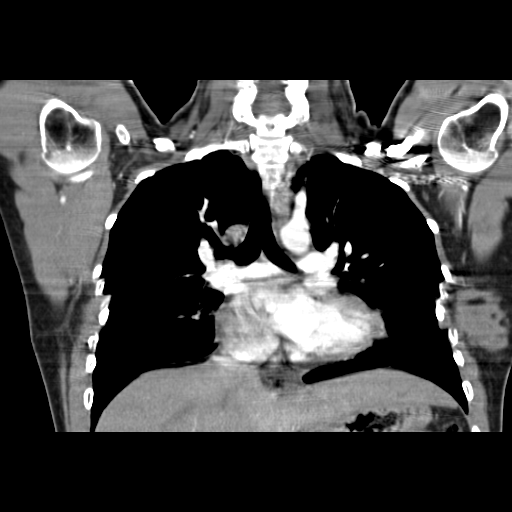

[19 of 36 positions shown; findings below may reference images not displayed]

FINDINGS: Study is degraded by motion artifact.

Thyroid gland within normal limits. No pathologically enlarged
mediastinal, hilar, or axillary lymph nodes identified.

Intrathoracic aorta of normal caliber and appearance. Great vessels
within normal limits.

Heart size is normal.  No pericardial effusion.

Evaluation of the pulmonary arterial tree fairly limited due to
extensive motion artifact. No large or central filling defects seen
to suggest acute pulmonary embolism. Re-formatted imaging confirms
these findings. Evaluation for possible distal segmental emboli for
early limited on this exam.

The lungs are clear without focal infiltrate or pulmonary edema. No
pleural effusion. No definite worsened pulmonary nodule or mass.

Heterogeneity within the splenic parenchyma favored to be related to
timing of the contrast bolus. Visualized upper abdomen otherwise
unremarkable.

No acute osseous abnormality. No worrisome lytic or blastic osseous
lesions.
IMPRESSION: 1. Fairly limited study due to motion artifact demonstrating no
large or central pulmonary embolus. Please note that evaluation for
possible smaller distal emboli is extremely limited on this exam.
2. No other acute cardiopulmonary abnormality.

## 2015-08-15 ENCOUNTER — Other Ambulatory Visit (HOSPITAL_COMMUNITY): Payer: Self-pay | Admitting: Nurse Practitioner

## 2015-08-15 DIAGNOSIS — Z3682 Encounter for antenatal screening for nuchal translucency: Secondary | ICD-10-CM

## 2015-08-16 ENCOUNTER — Ambulatory Visit (HOSPITAL_COMMUNITY): Admission: RE | Admit: 2015-08-16 | Payer: Medicaid Other | Source: Ambulatory Visit

## 2015-08-16 ENCOUNTER — Encounter (HOSPITAL_COMMUNITY): Payer: Self-pay

## 2015-08-16 ENCOUNTER — Ambulatory Visit (HOSPITAL_COMMUNITY)
Admission: RE | Admit: 2015-08-16 | Discharge: 2015-08-16 | Disposition: A | Discharge status: Home / Self Care | Payer: Medicaid Other | Source: Ambulatory Visit | Attending: Nurse Practitioner | Admitting: Nurse Practitioner

## 2015-08-16 DIAGNOSIS — Z36 Encounter for antenatal screening of mother: Secondary | ICD-10-CM | POA: Insufficient documentation

## 2015-08-16 DIAGNOSIS — Z3682 Encounter for antenatal screening for nuchal translucency: Secondary | ICD-10-CM

## 2015-08-16 DIAGNOSIS — Z3A12 12 weeks gestation of pregnancy: Secondary | ICD-10-CM | POA: Insufficient documentation

## 2015-08-16 HISTORY — DX: Other specified health status: Z78.9

## 2015-10-01 NOTE — L&D Delivery Note (Signed)
Patient is 25 y.o. [id number] 4372w4d admitted for active labor.   Delivery Note At 8:53 AM a healthy female was delivered via Vaginal, Spontaneous Delivery.  APGAR: 8,9; weight  Pending. Placenta status: Intact, Spontaneous.  Cord: 3 vessels with the following complications: None.   Anesthesia: None  Episiotomy:  None Lacerations: None  Suture Repair: N/A Est. Blood Loss (mL):  150cc  Mom to postpartum.  Baby to Couplet care / Skin to Skin.  Dr. Karyl KinnierKim Shanicqua Coldren delivered with Dr. Caroleen Hammanumley in attendance.  [facility]SR[facility] Rumley 02/28/2016, 9:06 AM   OB fellow attestation: Patient is a [id number] at 2072w4d who was admitted in active labor. Patient with uncomplicated prenatal course.  I was gloved and present for delivery in its entirety.  Second stage of labor progressed. Variable decels during second stage noted. SROM with moderate meconium noted  Complications: none  Lacerations: none  EBL: 150 cc  Federico FlakeKimberly Niles Alicen Donalson, MD 1:33 PM

## 2015-10-09 ENCOUNTER — Encounter (HOSPITAL_COMMUNITY): Payer: Self-pay

## 2015-10-09 ENCOUNTER — Inpatient Hospital Stay (HOSPITAL_COMMUNITY)
Admission: AD | Admit: 2015-10-09 | Discharge: 2015-10-09 | Disposition: A | Discharge status: Home / Self Care | Payer: Medicaid Other | Source: Ambulatory Visit | Attending: Family Medicine | Admitting: Family Medicine

## 2015-10-09 ENCOUNTER — Inpatient Hospital Stay (HOSPITAL_COMMUNITY): Payer: Medicaid Other

## 2015-10-09 DIAGNOSIS — Z3A20 20 weeks gestation of pregnancy: Secondary | ICD-10-CM | POA: Insufficient documentation

## 2015-10-09 DIAGNOSIS — R109 Unspecified abdominal pain: Secondary | ICD-10-CM | POA: Insufficient documentation

## 2015-10-09 DIAGNOSIS — O26892 Other specified pregnancy related conditions, second trimester: Secondary | ICD-10-CM | POA: Insufficient documentation

## 2015-10-09 DIAGNOSIS — O4692 Antepartum hemorrhage, unspecified, second trimester: Secondary | ICD-10-CM | POA: Diagnosis not present

## 2015-10-09 DIAGNOSIS — O4592 Premature separation of placenta, unspecified, second trimester: Secondary | ICD-10-CM

## 2015-10-09 LAB — CBC WITH DIFFERENTIAL/PLATELET
BASOS ABS: 0 10*3/uL (ref 0.0–0.1)
Basophils Relative: 0 %
Eosinophils Absolute: 0 10*3/uL (ref 0.0–0.7)
Eosinophils Relative: 0 %
HEMATOCRIT: 28.8 % — AB (ref 36.0–46.0)
Hemoglobin: 9.5 g/dL — ABNORMAL LOW (ref 12.0–15.0)
Lymphocytes Relative: 25 %
Lymphs Abs: 1.5 10*3/uL (ref 0.7–4.0)
MCH: 26 pg (ref 26.0–34.0)
MCHC: 33 g/dL (ref 30.0–36.0)
MCV: 78.7 fL (ref 78.0–100.0)
Monocytes Absolute: 0.2 10*3/uL (ref 0.1–1.0)
Monocytes Relative: 3 %
NEUTROS ABS: 4.4 10*3/uL (ref 1.7–7.7)
NEUTROS PCT: 72 %
Platelets: 173 10*3/uL (ref 150–400)
RBC: 3.66 MIL/uL — ABNORMAL LOW (ref 3.87–5.11)
RDW: 13.4 % (ref 11.5–15.5)
WBC: 6.1 10*3/uL (ref 4.0–10.5)

## 2015-10-09 LAB — URINALYSIS, ROUTINE W REFLEX MICROSCOPIC
Bilirubin Urine: NEGATIVE
GLUCOSE, UA: NEGATIVE mg/dL
Ketones, ur: 15 mg/dL — AB
Nitrite: NEGATIVE
Protein, ur: NEGATIVE mg/dL
pH: 6 (ref 5.0–8.0)

## 2015-10-09 LAB — URINE MICROSCOPIC-ADD ON

## 2015-10-09 MED ORDER — OXYCODONE-ACETAMINOPHEN 5-325 MG PO TABS
2.0000 | ORAL_TABLET | ORAL | Status: DC | PRN
  Administered 2015-10-09: 2 via ORAL
  Filled 2015-10-09: qty 2

## 2015-10-09 MED ORDER — OXYCODONE-ACETAMINOPHEN 5-325 MG PO TABS: 2.0000 | ORAL_TABLET | ORAL | Status: DC | PRN

## 2015-10-09 NOTE — MAU Provider Note (Signed)
History   G3P2 at 20.2 wks in with c/o fell on ice yesterday and hit on right side. Has had sharp pain ever since. Denies vag bleeding and ROM.  CSN: [id number]  Arrival date & time 10/09/15  1235   None     Chief Complaint  Patient presents with  . Fall  . Abdominal Pain    HPI  Past Medical History  Diagnosis Date  . No pertinent past medical history   . Medical history non-contributory     Past Surgical History  Procedure Laterality Date  . Laparoscopic appendectomy  08/21/2012    Procedure: APPENDECTOMY LAPAROSCOPIC;  Surgeon: Mariella SaaBenjamin T Hoxworth, MD;  Location: [facility];  Service: General;  Laterality: N/A;    History reviewed. No pertinent family history.  Social History  Substance Use Topics  . Smoking status: Never Smoker   . Smokeless tobacco: Never Used  . Alcohol Use: No    OB History    Gravida Para Term Preterm AB TAB SAB Ectopic Multiple Living   3 2 2  0 0 0 0 0 0 2      Review of Systems  Constitutional: Negative.   HENT: Negative.   Eyes: Negative.   Respiratory: Negative.   Cardiovascular: Negative.   Gastrointestinal: Positive for abdominal pain.  Endocrine: Negative.   Genitourinary: Negative.   Musculoskeletal: Negative.   Skin: Negative.   Allergic/Immunologic: Negative.   Neurological: Negative.   Hematological: Negative.   Psychiatric/Behavioral: Negative.     Allergies  Review of patient's allergies indicates no known allergies.  Home Medications  No current outpatient prescriptions on file.  BP 104/59 mmHg  Pulse 101  Temp(Src) 98.1 F (36.7 C) (Oral)  Resp 16  LMP 05/20/2015  Physical Exam  Constitutional: She is oriented to person, place, and time. She appears well-developed and well-nourished.  HENT:  Head: Normocephalic.  Eyes: Pupils are equal, round, and reactive to light.  Neck: Normal range of motion.  Cardiovascular: Normal rate, regular rhythm, normal heart sounds and intact distal pulses.    Pulmonary/Chest: Effort normal and breath sounds normal.  Abdominal: Soft. Bowel sounds are normal.  Genitourinary: Vagina normal and uterus normal.  Musculoskeletal: Normal range of motion.  Neurological: She is alert and oriented to person, place, and time. She has normal reflexes.  Skin: Skin is warm and dry.  Psychiatric: She has a normal mood and affect. Her behavior is normal. Judgment and thought content normal.    MAU Course  Procedures (including critical care time)  Labs Reviewed  URINALYSIS, ROUTINE W REFLEX MICROSCOPIC (NOT AT [facility])   No results found.   No diagnosis found.    MDM  FHR 164 st and reg per doppler. abd soft, SVE firm/cl/th/high. No vag bleeding, no leaking of fluid.  Dx: Fall at 20.2 wks. Abruption  Plan: repeat us in ten days and manage pain.

## 2015-10-09 NOTE — Discharge Instructions (Signed)
Placental Abruption  Your placenta is the organ that nourishes your unborn baby (fetus). Your baby gets his or her blood supply and nutrients through your placenta. It is your baby's life support system. It is attached to the inside of your uterus until after your baby is born.   Placental abruption is when the placenta partly or completely separates from the uterus before your baby is born. This is rare, but it can happen any time after 20 weeks of pregnancy. A small separation may not cause problems, but a large separation may be dangerous for you and your baby.  CAUSES   Most of the time the cause of a placental abruption is unknown. Though it is rare, a placental abruption can be caused by:    An abdominal injury.    The baby turning from a buttocks-first position (breech presentation) or a sideways position (transverse) to a headfirst position (cephalic).    Delivering the first of multiple babies (twins, triplets, or more).    Sudden loss of amniotic fluid (premature rupture of the membranes).    An abnormally short umbilical cord.  RISK FACTORS  Some risk factors make a placental abruption more likely, including:   History of placental abruption.   High blood pressure (hypertension).   Smoking.   Alcohol intake.   Blood clotting problems.   Too much amniotic fluid.   Having had multiples (twins or triplets or more).   Seizures and convulsions.   Diabetes mellitus.   Having had more than four children.   Age 35 years or older.   Illegal drug use.   Injury to your abdomen.  SIGNS AND SYMPTOMS   A small placental abruption may not cause symptoms. If you do have symptoms, they may include:   Mild abdominal pain.   Slight vaginal bleeding.  Symptoms of severe placental abruption depend on the size of the separation and the stage of pregnancy. Symptoms may include:    Sudden pain in your uterus.   Abdominal pain.   Vaginal bleeding.   Tender uterus.   Severe abdominal pain with  tenderness.   Continual contractions of your uterus.   Back pain.   Weakness, light-headedness.  DIAGNOSIS   Placental abruption is suspected when a pregnant woman develops sudden pain in her uterus. The health care provider will check whether the uterus is very tender, hard, and enlarging and whether the baby has an abnormal heart rate or rhythm. Ultrasonography (commonly called an ultrasound) will be done. Blood work will also be done to make sure that there are enough healthy red blood cells and that there are no clotting problems or signs of too much blood loss.  TREATMENT   Placental abruption is usually an emergency. It requires treatment right away. Your treatment will depend on:    The amount of bleeding.   Whether you or you baby are in distress.   The stage of your pregnancy.   The maturity of the baby.  Treatment for partial separation of the placenta is bed rest and close observation. You also may need a blood transfusion or to receive fluids through an IV tube. Treatment for complete placental separation is delivery of your baby. You may have a cesarean delivery if your baby is in distress.  HOME CARE INSTRUCTIONS    Only take medicines as directed by your health care provider.   Arrange for help at home before and after you deliver the baby, especially if you had a cesarean delivery or   lost a lot of blood.   Get plenty of rest and sleep.   Do not have sexual intercourse until your health care provider says it is okay.   Do not use tampons or douche unless your health care provider says it is okay.  SEEK MEDICAL CARE IF:   You have light vaginal bleeding or spotting.   You have any type of trauma, such as a fall or jolt during an accident.   You are having trouble avoiding drugs, alcohol, or smoking.  SEEK IMMEDIATE MEDICAL CARE IF:   You have vaginal bleeding.   You have abdominal pain.   You have continuous uterine contractions.   You have a hard, tender uterus.   You do not feel  the baby move, or the baby moves very little.  MAKE SURE YOU:   Understand these instructions.   Will watch your condition.   Will get help right away if you are not doing well or get worse.     This information is not intended to replace advice given to you by your health care provider. Make sure you discuss any questions you have with your health care provider.     Document Released: 09/16/2005 Document Revised: 09/21/2013 Document Reviewed: 07/09/2013  Elsevier Interactive Patient Education 2016 [employer]

## 2015-10-09 NOTE — MAU Note (Signed)
Pt fell on R side yesterday, has vomited blood today, had bleeding when using the bathroom today, unsure if it is hematuria or vaginal bleeding.  Also C/O RLQ pain since last night, pain is worse today.

## 2015-10-19 ENCOUNTER — Ambulatory Visit (HOSPITAL_COMMUNITY)
Admission: RE | Admit: 2015-10-19 | Discharge: 2015-10-19 | Disposition: A | Discharge status: Home / Self Care | Payer: BLUE CROSS/BLUE SHIELD | Source: Ambulatory Visit | Attending: Nurse Practitioner | Admitting: Nurse Practitioner

## 2015-10-19 ENCOUNTER — Other Ambulatory Visit (HOSPITAL_COMMUNITY): Payer: Self-pay | Admitting: Obstetrics and Gynecology

## 2015-10-19 ENCOUNTER — Encounter (HOSPITAL_COMMUNITY): Payer: Self-pay

## 2015-10-19 DIAGNOSIS — O4592 Premature separation of placenta, unspecified, second trimester: Secondary | ICD-10-CM | POA: Insufficient documentation

## 2015-10-19 DIAGNOSIS — O4692 Antepartum hemorrhage, unspecified, second trimester: Secondary | ICD-10-CM

## 2015-10-19 DIAGNOSIS — Z3A21 21 weeks gestation of pregnancy: Secondary | ICD-10-CM | POA: Diagnosis not present

## 2015-10-19 NOTE — ED Notes (Signed)
Pt reports clear watery discharge for one week.

## 2015-10-20 ENCOUNTER — Ambulatory Visit (HOSPITAL_COMMUNITY): Payer: Self-pay

## 2015-10-24 ENCOUNTER — Other Ambulatory Visit (HOSPITAL_COMMUNITY): Payer: Self-pay | Admitting: Physician Assistant

## 2015-10-24 ENCOUNTER — Other Ambulatory Visit: Payer: Self-pay | Admitting: Obstetrics and Gynecology

## 2015-10-24 DIAGNOSIS — O283 Abnormal ultrasonic finding on antenatal screening of mother: Secondary | ICD-10-CM

## 2015-10-24 DIAGNOSIS — Z3A25 25 weeks gestation of pregnancy: Secondary | ICD-10-CM

## 2015-10-24 DIAGNOSIS — Z0489 Encounter for examination and observation for other specified reasons: Secondary | ICD-10-CM

## 2015-10-24 DIAGNOSIS — IMO0002 Reserved for concepts with insufficient information to code with codable children: Secondary | ICD-10-CM

## 2015-11-16 ENCOUNTER — Ambulatory Visit (HOSPITAL_COMMUNITY)
Admission: RE | Admit: 2015-11-16 | Discharge: 2015-11-16 | Disposition: A | Discharge status: Home / Self Care | Payer: BLUE CROSS/BLUE SHIELD | Source: Ambulatory Visit | Attending: Physician Assistant | Admitting: Physician Assistant

## 2015-11-16 ENCOUNTER — Encounter (HOSPITAL_COMMUNITY): Payer: Self-pay

## 2015-11-16 ENCOUNTER — Other Ambulatory Visit (HOSPITAL_COMMUNITY): Payer: Self-pay | Admitting: Physician Assistant

## 2015-11-16 DIAGNOSIS — Z36 Encounter for antenatal screening of mother: Secondary | ICD-10-CM | POA: Insufficient documentation

## 2015-11-16 DIAGNOSIS — O4592 Premature separation of placenta, unspecified, second trimester: Secondary | ICD-10-CM

## 2015-11-16 DIAGNOSIS — IMO0002 Reserved for concepts with insufficient information to code with codable children: Secondary | ICD-10-CM

## 2015-11-16 DIAGNOSIS — Z3A25 25 weeks gestation of pregnancy: Secondary | ICD-10-CM

## 2015-11-16 DIAGNOSIS — Z0489 Encounter for examination and observation for other specified reasons: Secondary | ICD-10-CM

## 2015-11-16 DIAGNOSIS — Z3689 Encounter for other specified antenatal screening: Secondary | ICD-10-CM

## 2015-11-16 DIAGNOSIS — O283 Abnormal ultrasonic finding on antenatal screening of mother: Secondary | ICD-10-CM

## 2015-12-11 ENCOUNTER — Other Ambulatory Visit (HOSPITAL_COMMUNITY): Payer: Self-pay

## 2015-12-11 ENCOUNTER — Encounter (HOSPITAL_COMMUNITY): Payer: Self-pay

## 2015-12-14 ENCOUNTER — Encounter (HOSPITAL_COMMUNITY): Payer: Self-pay

## 2015-12-14 ENCOUNTER — Ambulatory Visit (HOSPITAL_COMMUNITY)
Admission: RE | Admit: 2015-12-14 | Discharge: 2015-12-14 | Disposition: A | Discharge status: Home / Self Care | Payer: BLUE CROSS/BLUE SHIELD | Source: Ambulatory Visit | Attending: Family Medicine | Admitting: Family Medicine

## 2015-12-14 DIAGNOSIS — Z3A29 29 weeks gestation of pregnancy: Secondary | ICD-10-CM | POA: Insufficient documentation

## 2015-12-14 DIAGNOSIS — O4592 Premature separation of placenta, unspecified, second trimester: Secondary | ICD-10-CM

## 2015-12-14 DIAGNOSIS — IMO0002 Reserved for concepts with insufficient information to code with codable children: Secondary | ICD-10-CM

## 2015-12-14 DIAGNOSIS — O4593 Premature separation of placenta, unspecified, third trimester: Secondary | ICD-10-CM | POA: Diagnosis present

## 2015-12-14 DIAGNOSIS — Z0489 Encounter for examination and observation for other specified reasons: Secondary | ICD-10-CM

## 2016-02-28 ENCOUNTER — Inpatient Hospital Stay (HOSPITAL_COMMUNITY)
Admission: AD | Admit: 2016-02-28 | Discharge: 2016-03-01 | DRG: 775 | Disposition: A | Discharge status: Home / Self Care | Payer: Medicaid Other | Source: Ambulatory Visit | Attending: Obstetrics and Gynecology | Admitting: Obstetrics and Gynecology

## 2016-02-28 ENCOUNTER — Encounter (HOSPITAL_COMMUNITY): Payer: Self-pay

## 2016-02-28 DIAGNOSIS — O9902 Anemia complicating childbirth: Secondary | ICD-10-CM | POA: Diagnosis present

## 2016-02-28 DIAGNOSIS — Z3A40 40 weeks gestation of pregnancy: Secondary | ICD-10-CM | POA: Diagnosis not present

## 2016-02-28 DIAGNOSIS — D649 Anemia, unspecified: Secondary | ICD-10-CM

## 2016-02-28 DIAGNOSIS — O99824 Streptococcus B carrier state complicating childbirth: Secondary | ICD-10-CM | POA: Diagnosis present

## 2016-02-28 DIAGNOSIS — D508 Other iron deficiency anemias: Secondary | ICD-10-CM | POA: Diagnosis present

## 2016-02-28 LAB — TYPE AND SCREEN
ABO/RH(D): A POS
ANTIBODY SCREEN: NEGATIVE

## 2016-02-28 LAB — CBC
HCT: 29.2 % — ABNORMAL LOW (ref 36.0–46.0)
Hemoglobin: 8.8 g/dL — ABNORMAL LOW (ref 12.0–15.0)
MCH: 20.1 pg — ABNORMAL LOW (ref 26.0–34.0)
MCHC: 30.1 g/dL (ref 30.0–36.0)
MCV: 66.7 fL — ABNORMAL LOW (ref 78.0–100.0)
Platelets: 160 10*3/uL (ref 150–400)
RBC: 4.38 MIL/uL (ref 3.87–5.11)
RDW: 17.8 % — AB (ref 11.5–15.5)
WBC: 6.4 10*3/uL (ref 4.0–10.5)

## 2016-02-28 MED ORDER — SOD CITRATE-CITRIC ACID 500-334 MG/5ML PO SOLN: 30.0000 mL | ORAL | Status: DC | PRN

## 2016-02-28 MED ORDER — DIPHENHYDRAMINE HCL 25 MG PO CAPS: 25.0000 mg | ORAL_CAPSULE | Freq: Four times a day (QID) | ORAL | Status: DC | PRN

## 2016-02-28 MED ORDER — ONDANSETRON HCL 4 MG PO TABS: 4.0000 mg | ORAL_TABLET | ORAL | Status: DC | PRN

## 2016-02-28 MED ORDER — OXYCODONE-ACETAMINOPHEN 5-325 MG PO TABS: 1.0000 | ORAL_TABLET | ORAL | Status: DC | PRN

## 2016-02-28 MED ORDER — OXYTOCIN 40 UNITS IN LACTATED RINGERS INFUSION - SIMPLE MED
2.5000 [IU]/h | INTRAVENOUS | Status: DC
  Administered 2016-02-28: 2.5 [IU]/h via INTRAVENOUS
  Filled 2016-02-28: qty 1000

## 2016-02-28 MED ORDER — LACTATED RINGERS IV SOLN: 500.0000 mL | INTRAVENOUS | Status: DC | PRN

## 2016-02-28 MED ORDER — ACETAMINOPHEN 325 MG PO TABS
650.0000 mg | ORAL_TABLET | ORAL | Status: DC | PRN
  Administered 2016-02-29 – 2016-03-01 (×2): 650 mg via ORAL
  Filled 2016-02-28 (×2): qty 2

## 2016-02-28 MED ORDER — IBUPROFEN 600 MG PO TABS
600.0000 mg | ORAL_TABLET | Freq: Four times a day (QID) | ORAL | Status: DC
  Administered 2016-02-28 – 2016-03-01 (×7): 600 mg via ORAL
  Filled 2016-02-28 (×7): qty 1

## 2016-02-28 MED ORDER — OXYCODONE-ACETAMINOPHEN 5-325 MG PO TABS: 2.0000 | ORAL_TABLET | ORAL | Status: DC | PRN

## 2016-02-28 MED ORDER — LIDOCAINE HCL (PF) 1 % IJ SOLN
30.0000 mL | INTRAMUSCULAR | Status: DC | PRN
Start: 2016-02-28 — End: 2016-02-28
  Filled 2016-02-28: qty 30

## 2016-02-28 MED ORDER — TETANUS-DIPHTH-ACELL PERTUSSIS 5-2.5-18.5 LF-MCG/0.5 IM SUSP
0.5000 mL | Freq: Once | INTRAMUSCULAR | Status: DC
Start: 2016-02-29 — End: 2016-03-01

## 2016-02-28 MED ORDER — SIMETHICONE 80 MG PO CHEW: 80.0000 mg | CHEWABLE_TABLET | ORAL | Status: DC | PRN

## 2016-02-28 MED ORDER — WITCH HAZEL-GLYCERIN EX PADS: 1.0000 "application " | MEDICATED_PAD | CUTANEOUS | Status: DC | PRN

## 2016-02-28 MED ORDER — COCONUT OIL OIL: 1.0000 "application " | TOPICAL_OIL | Status: DC | PRN

## 2016-02-28 MED ORDER — OXYCODONE-ACETAMINOPHEN 5-325 MG PO TABS
1.0000 | ORAL_TABLET | ORAL | Status: DC | PRN
  Administered 2016-02-28 – 2016-02-29 (×3): 1 via ORAL
  Filled 2016-02-28 (×2): qty 1

## 2016-02-28 MED ORDER — SENNOSIDES-DOCUSATE SODIUM 8.6-50 MG PO TABS
2.0000 | ORAL_TABLET | ORAL | Status: DC
  Administered 2016-02-28 – 2016-02-29 (×2): 2 via ORAL
  Filled 2016-02-28 (×2): qty 2

## 2016-02-28 MED ORDER — ONDANSETRON HCL 4 MG/2ML IJ SOLN: 4.0000 mg | Freq: Four times a day (QID) | INTRAMUSCULAR | Status: DC | PRN

## 2016-02-28 MED ORDER — BENZOCAINE-MENTHOL 20-0.5 % EX AERO: 1.0000 "application " | INHALATION_SPRAY | CUTANEOUS | Status: DC | PRN

## 2016-02-28 MED ORDER — LACTATED RINGERS IV SOLN
INTRAVENOUS | Status: DC
  Administered 2016-02-28: 08:00:00 via INTRAVENOUS

## 2016-02-28 MED ORDER — SODIUM CHLORIDE 0.9 % IV SOLN
2.0000 g | Freq: Once | INTRAVENOUS | Status: DC
  Administered 2016-02-28: 2 g via INTRAVENOUS
  Filled 2016-02-28: qty 2000

## 2016-02-28 MED ORDER — ONDANSETRON HCL 4 MG/2ML IJ SOLN
4.0000 mg | INTRAMUSCULAR | Status: DC | PRN
  Administered 2016-02-29: 4 mg via INTRAVENOUS
  Filled 2016-02-28: qty 2

## 2016-02-28 MED ORDER — OXYCODONE-ACETAMINOPHEN 5-325 MG PO TABS
2.0000 | ORAL_TABLET | ORAL | Status: DC | PRN
  Filled 2016-02-28: qty 2

## 2016-02-28 MED ORDER — ACETAMINOPHEN 325 MG PO TABS: 650.0000 mg | ORAL_TABLET | ORAL | Status: DC | PRN

## 2016-02-28 MED ORDER — PRENATAL MULTIVITAMIN CH: 1.0000 | ORAL_TABLET | Freq: Every day | ORAL | Status: DC

## 2016-02-28 MED ORDER — ZOLPIDEM TARTRATE 5 MG PO TABS: 5.0000 mg | ORAL_TABLET | Freq: Every evening | ORAL | Status: DC | PRN

## 2016-02-28 MED ORDER — OXYTOCIN BOLUS FROM INFUSION
500.0000 mL | INTRAVENOUS | Status: DC
  Administered 2016-02-28: 500 mL via INTRAVENOUS

## 2016-02-28 MED ORDER — DIBUCAINE 1 % RE OINT: 1.0000 "application " | TOPICAL_OINTMENT | RECTAL | Status: DC | PRN

## 2016-02-28 NOTE — H&P (Signed)
LABOR AND DELIVERY ADMISSION HISTORY AND PHYSICAL NOTE  Valerie [name] is a 25 y.o. female 393P2002 with IUP at 2339w4d by L/12 presenting for painful contractions.   She reports positive fetal movement. She denies leakage of fluid or vaginal bleeding.  Prenatal History/Complications:  Past Medical History: Past Medical History  Diagnosis Date  . No pertinent past medical history   . Medical history non-contributory     Past Surgical History: Past Surgical History  Procedure Laterality Date  . Laparoscopic appendectomy  08/21/2012    Procedure: APPENDECTOMY LAPAROSCOPIC;  Surgeon: Mariella SaaBenjamin T Hoxworth, MD;  Location: [facility];  Service: General;  Laterality: N/A;    Obstetrical History: OB History    Gravida Para Term Preterm AB TAB SAB Ectopic Multiple Living   3 2 2  0 0 0 0 0 0 2      Social History: Social History   Social History  . Marital Status: Married    Spouse Name: N/A  . Number of Children: N/A  . Years of Education: N/A   Social History Main Topics  . Smoking status: Never Smoker   . Smokeless tobacco: Never Used  . Alcohol Use: No  . Drug Use: No  . Sexual Activity: Yes   Other Topics Concern  . Not on file   Social History Narrative    Family History: No family history on file.  Allergies: No Known Allergies  Prescriptions prior to admission  Medication Sig Dispense Refill Last Dose  . IRON PO Take by mouth.   Taking  . oxyCODONE-acetaminophen (PERCOCET/ROXICET) 5-325 MG tablet Take 2 tablets by mouth every 4 (four) hours as needed (pain). (Patient not taking: Reported on 11/16/2015) 30 tablet 0 Not Taking  . Prenatal Vit w/Fe-Methylfol-FA (PNV PO) Take by mouth.   Taking     Review of Systems   All systems reviewed and negative except as stated in HPI  Blood pressure 107/63, pulse 92, temperature 98.2 F (36.8 C), resp. rate 18, last menstrual period 05/20/2015, unknown if currently breastfeeding. General appearance: alert, cooperative  and appears stated age Lungs: clear to auscultation bilaterally Heart: regular rate and rhythm Abdomen: soft, non-tender; bowel sounds normal Extremities: No calf swelling or tenderness Presentation: cephalic  Fetal monitoring: 140s Uterine activity: q 2-3 min  Dilation: 6 Effacement (%): 100 Exam by:: Ginger Morris RN   Prenatal labs: ABO, Rh:  a pos Antibody:  neg Rubella: neg RPR:   neg HBsAg:   neg HIV:   neg GBS:   pos 1 hr Glucola: 104 Genetic screening:  Quad wnl Anatomy US: wnl  Prenatal Transfer Tool  Maternal Diabetes: No Genetic Screening: Normal Maternal Ultrasounds/Referrals: Normal Fetal Ultrasounds or other Referrals:  None Maternal Substance Abuse:  No Significant Maternal Medications:  None Significant Maternal Lab Results: Lab values include: Group B Strep positive  No results found for this or any previous visit (from the past 24 hour(s)).  Patient Active Problem List   Diagnosis Date Noted  . Post-dates pregnancy 09/19/2014  . Post-term pregnancy, 40-42 weeks of gestation   . [redacted] weeks gestation of pregnancy   . Supervision of normal pregnancy 09/27/2011    Assessment: Valerie [name] is a 25 y.o. G3P2002 at 3639w4d here for active labor at term.  #Labor: expectant #Pain: Non-pharmacologic #FWB: normal fhts by handheld doppler, will be placed on monitor when admitted #ID:  gbs pos, ordering ampicillin given active labor #MOF: br/bot #MOC: lng-iud #Circ:  N/a #Anemia: f/u cbc  Noah B  Wouk 02/28/2016, 7:33 AM

## 2016-02-28 NOTE — MAU Note (Signed)
Pt presents to MAU with complaints of contractions that started at 6 this morning. Denies any vaginal bleeding or LOF

## 2016-02-29 LAB — CBC
HCT: 26.9 % — ABNORMAL LOW (ref 36.0–46.0)
HEMOGLOBIN: 8.2 g/dL — AB (ref 12.0–15.0)
MCH: 20.2 pg — AB (ref 26.0–34.0)
MCHC: 30.5 g/dL (ref 30.0–36.0)
MCV: 66.3 fL — ABNORMAL LOW (ref 78.0–100.0)
Platelets: 143 10*3/uL — ABNORMAL LOW (ref 150–400)
RBC: 4.06 MIL/uL (ref 3.87–5.11)
RDW: 17.9 % — ABNORMAL HIGH (ref 11.5–15.5)
WBC: 9 10*3/uL (ref 4.0–10.5)

## 2016-02-29 LAB — RPR: RPR: NONREACTIVE

## 2016-02-29 MED ORDER — SODIUM CHLORIDE 0.9 % IV SOLN
510.0000 mg | Freq: Once | INTRAVENOUS | Status: AC
  Administered 2016-02-29: 510 mg via INTRAVENOUS
  Filled 2016-02-29: qty 17

## 2016-02-29 MED ORDER — LACTATED RINGERS IV BOLUS (SEPSIS)
1000.0000 mL | Freq: Once | INTRAVENOUS | Status: AC
  Administered 2016-02-29: 1000 mL via INTRAVENOUS

## 2016-02-29 NOTE — Progress Notes (Signed)
Patient continues to complain of severe dizziness. She was assisted to reposition and stand at bedside to assess orthostatic vital signs. Patient became nauseated and attempting to vomit, although no emesis was expelled. Patient was then assisted back to bed to rest in a lateral Sims position. She quickly fell asleep.   Patient states that she has not had much of an appetite and has not been eating much. She was given one Percocet for severe cramping at approximately 0500 this morning. Will continue to observe closely and administer IVF LR bolus  as ordered.

## 2016-02-29 NOTE — Lactation Note (Signed)
This note was copied from a baby's chart. Lactation Consultation Note  Mother denies questions or problems. States breastfeeding is going well. Mother is breastfeeding on both breasts. Encouraged her to call if she needs further assistance.  Patient Name: Girl Tamala FothergillZainab Vondra NWGNF'AToday's Date: 02/29/2016     Maternal Data    Feeding Length of feed: 7 min  LATCH Score/Interventions Latch: Grasps breast easily, tongue down, lips flanged, rhythmical sucking. Intervention(s): Skin to skin  Audible Swallowing: A few with stimulation  Type of Nipple: Everted at rest and after stimulation  Comfort (Breast/Nipple): Soft / non-tender     Hold (Positioning): No assistance needed to correctly position infant at breast.  LATCH Score: 9  Lactation Tools Discussed/Used     Consult Status      Dahlia ByesBerkelhammer, [facility] CenterBoschen 02/29/2016, 3:34 PM

## 2016-02-29 NOTE — Progress Notes (Signed)
Post Partum Day 1 Subjective:  Valerie [name] is a 25 y.o. [id number] 3536w4d s/p SVD at 0934 yesterday.  No acute events overnight.  Pt denies problems with ambulating, voiding or po intake.  She denies nausea or vomiting, but complains of severe dizziness when she stands to ambulate.  Pain is well controlled.  Lochia Moderate.  Plan for birth control is IUD.  Method of Feeding: Breast feeding - going well.  Objective: Blood pressure 103/54, pulse 51, temperature 97.8 F (36.6 C), temperature source Oral, resp. rate 18, height 5\' 3"  (1.6 m), weight 61.236 kg (135 lb), last menstrual period 05/20/2015, SpO2 100 %, unknown if currently breastfeeding.  Physical Exam:  General: alert, cooperative and no distress Lochia:normal flow Chest: normal WOB Heart: Regular rate Abdomen: +BS, soft, mild TTP (appropriate) Uterine Fundus: firm DVT Evaluation: No evidence of DVT seen on physical exam. Extremities: no edema   Recent Labs  02/28/16 0730 02/29/16 0521  HGB 8.8* 8.2*  HCT 29.2* 26.9*    Assessment/Plan:  ASSESSMENT: Valerie [name] is a 25 y.o. [id number] 6136w4d s/p SVD, doing well.  #Pain: Well controlled with ibuprofen and PRN oxycodone-acetaminophen.  #FEN/GI: Tolerating normal diet  #Dizziness: Likely due to microcytic anemia/iron deficiency - Iron transfusion - Will start pt on PO iron TID.  #postpartum care: -Continue routine PP care -Breastfeeding support PRN - Lactation Consult - Contraception: IUD -Anticipate d/c tomorrow   LOS: 1 day   Barnie Mortolleen Yariana Hoaglund, Med Student

## 2016-02-29 NOTE — Lactation Note (Signed)
This note was copied from a baby's chart. Lactation Consultation Note Experienced BF mom has a 564 yr old whom she BF for 2 yrs. Her 23 yr old Bf for 6 months and became pregnant with this baby and stopped. Mom has small nipples everted well w/stimulation. Taught hand expression. Mom holding baby on her chest STS. Mom stated she didn't have any trouble Bf her other children and doesn't have questions at this time. Referred to Baby and Me Book in Breastfeeding section Pg. 22-23 for position options and Proper latch demonstration.[facility]/LC brochure given w/resources, support groups and LC services. Patient Name: Valerie Tamala FothergillZainab [name] WUJWJ'XToday's Date: 02/29/2016 Reason for consult: Initial assessment   Maternal Data Has patient been taught Hand Expression?: Yes Does the patient have breastfeeding experience prior to this delivery?: Yes  Feeding    LATCH Score/Interventions       Type of Nipple: Everted at rest and after stimulation  Comfort (Breast/Nipple): Soft / non-tender     Hold (Positioning): No assistance needed to correctly position infant at breast. Intervention(s): Breastfeeding basics reviewed;Support Pillows;Position options;Skin to skin     Lactation Tools Discussed/Used WIC Program: Yes   Consult Status Consult Status: PRN Date: 03/01/16 Follow-up type: In-patient    Sammuel Blick, Diamond NickelLAURA G 02/29/2016, 4:02 AM

## 2016-02-29 NOTE — Progress Notes (Signed)
After awakening from napping, patient attempted to eat cereal for breakfast; however she promptly vomited 250 cc liquid emesis with undigested food at approximately 1045. Patient given Zofran 4 mg. IV at 1047 and encouraged to rest and refrain from trying to eat more solid food at this time. Patient given gingerale to sip. Plan to reassess condition within one hour.

## 2016-03-01 MED ORDER — FERROUS SULFATE 325 (65 FE) MG PO TABS: 325.0000 mg | ORAL_TABLET | Freq: Two times a day (BID) | ORAL | Status: DC

## 2016-03-01 MED ORDER — IBUPROFEN 600 MG PO TABS: 600.0000 mg | ORAL_TABLET | Freq: Four times a day (QID) | ORAL | Status: DC | PRN

## 2016-03-01 NOTE — Lactation Note (Signed)
This note was copied from a baby's chart. Lactation Consultation Note Experienced BF mom, baby had 7% weight loss. Encouraged longer feeding time baby had weight loss. Encouraged to massage breast during BF at intervals. Mom is very pale. Has hx; of anemia, Hgb. 8,  Baby cueing and hearing swallows. Had 6 voids and 7 stools in 40 hrs. WDL output. Patient Name: Valerie [name]: 03/01/2016 Reason for consult: Follow-up assessment;Infant weight loss   Maternal Data    Feeding    LATCH Score/Interventions Intervention(s): Skin to skin;Teach feeding cues;Waking techniques     Type of Nipple: Everted at rest and after stimulation  Comfort (Breast/Nipple): Soft / non-tender     Hold (Positioning): No assistance needed to correctly position infant at breast. Intervention(s): Support Pillows;Position options;Skin to skin     Lactation Tools Discussed/Used     Consult Status Consult Status: Follow-up [name]: 03/01/16 (in pm) Follow-up type: In-patient    Charyl DancerCARVER, Amar Sippel G 03/01/2016, 3:39 AM

## 2016-03-01 NOTE — Discharge Summary (Signed)
OB Discharge Summary     Patient Name: Valerie [name] DOB: 04/13/1991 MRN: [id number]  Date of admission: 02/28/2016 Delivering MD: Lyndel SafeNEWTON, KIMBERLY NILES   Date of discharge: 03/01/2016  Admitting diagnosis: 40WKS,LABOR Intrauterine pregnancy: 2165w4d     Secondary diagnosis:  Active Problems:   Labor and delivery indication for care or intervention  Additional problems: none     Discharge diagnosis: Term Pregnancy Delivered                                                                                                Post partum procedures: IV ferraheme for symptomatic anemia  Augmentation: n/a  Complications: None  Hospital course:  Onset of Labor With Vaginal Delivery     25 y.o. yo [id number] at 1565w4d was admitted in Active Labor on 02/28/2016. Patient had an uncomplicated labor course as follows:  Membrane Rupture Time/Date: 8:18 AM ,02/28/2016   Intrapartum Procedures: Episiotomy: None [1]                                         Lacerations:  None [1]  Patient had a delivery of a Viable infant. 02/28/2016  Information for the patient's newborn:  Remi Deterhmed, Girl Masayo [[id number]][[id number]]  Delivery Method: Vaginal, Spontaneous Delivery (Filed from Delivery Summary)    Pateint had an uncomplicated postpartum course.  She is ambulating, tolerating a regular diet, passing flatus, and urinating well. Patient is discharged home in stable condition on 03/01/2016.    Physical exam  Filed Vitals:   02/29/16 0818 02/29/16 0822 02/29/16 0825 02/29/16 1728  BP: 109/57 110/70 107/68 110/68  Pulse: 53 66 86 53  Temp:    98.8 F (37.1 C)  TempSrc:    Oral  Resp: 18   18  Height:      Weight:      SpO2:       General: alert, cooperative and no distress Lochia: appropriate Uterine Fundus: firm Incision: N/A DVT Evaluation: No evidence of DVT seen on physical exam. Labs: Lab Results  Component Value Date   WBC 9.0 02/29/2016   HGB 8.2* 02/29/2016   HCT 26.9* 02/29/2016   MCV 66.3*  02/29/2016   PLT 143* 02/29/2016   CMP Latest Ref Rng 09/19/2014  Glucose 70 - 99 mg/dL 97  BUN 6 - 23 mg/dL 6  Creatinine 8.410.50 - 3.241.10 mg/dL 4.01(U0.44(L)  Sodium 272137 - 536147 mEq/L 139  Potassium 3.7 - 5.3 mEq/L 3.8  Chloride 96 - 112 mEq/L 103  CO2 19 - 32 mEq/L 21  Calcium 8.4 - 10.5 mg/dL 8.7  Total Protein 6.0 - 8.3 g/dL 6.7  Total Bilirubin 0.3 - 1.2 mg/dL 0.3  Alkaline Phos 39 - 117 U/L 314(H)  AST 0 - 37 U/L 14  ALT 0 - 35 U/L <5    Discharge instruction: per After Visit Summary and "Baby and Me Booklet".  After visit meds:    Medication List    ASK your doctor about these medications  IRON PO  Take by mouth.     oxyCODONE-acetaminophen 5-325 MG tablet  Commonly known as:  PERCOCET/ROXICET  Take 2 tablets by mouth every 4 (four) hours as needed (pain).     PNV PO  Take by mouth.        Diet: routine diet  Activity: Advance as tolerated. Pelvic rest for 6 weeks.   Outpatient follow up:6 weeks Follow up Appt:No future appointments. Follow up Visit:No Follow-up on file.  Postpartum contraception: IUD Mirena  Newborn Data: Live born female  Birth Weight: 8 lb 1.6 oz (3675 g) APGAR: 9, 9  Baby Feeding: Bottle and Breast Disposition:home with mother   03/01/2016 Greig Right, CNM

## 2016-03-01 NOTE — Lactation Note (Signed)
This note was copied from a baby's chart. Lactation Consultation Note  Mother states she got exhausted during the night and gave the baby formula. Baby asleep in mother's arms and mother tired. Discussed supply and demand. Encouraged her to put baby in the crib or have FOB hold infant so mother can rest between feedings. Reminded mother to breastfeed on both breasts per feeding, burping in between. Stools are transitioning and are green. Reviewed engorgement care and monitoring voids/stools.   Patient Name: Girl Tamala FothergillZainab Hopes ZOXWR'UToday's Date: 03/01/2016 Reason for consult: Follow-up assessment   Maternal Data    Feeding Feeding Type: Breast Fed Length of feed: 20 min  LATCH Score/Interventions                      Lactation Tools Discussed/Used     Consult Status Consult Status: Complete    Valerie [name], Valerie [name] 03/01/2016, 8:49 AM

## 2016-03-01 NOTE — Discharge Instructions (Signed)
Postpartum Care After Vaginal Delivery °After you deliver your newborn (postpartum period), the usual stay in the hospital is 24-72 hours. If there were problems with your labor or delivery, or if you have other medical problems, you might be in the hospital longer.  °While you are in the hospital, you will receive help and instructions on how to care for yourself and your newborn during the postpartum period.  °While you are in the hospital: °· Be sure to tell your nurses if you have pain or discomfort, as well as where you feel the pain and what makes the pain worse. °· If you had an incision made near your vagina (episiotomy) or if you had some tearing during delivery, the nurses may put ice packs on your episiotomy or tear. The ice packs may help to reduce the pain and swelling. °· If you are breastfeeding, you may feel uncomfortable contractions of your uterus for a couple of weeks. This is normal. The contractions help your uterus get back to normal size. °· It is normal to have some bleeding after delivery. °· For the first 1-3 days after delivery, the flow is red and the amount may be similar to a period. °· It is common for the flow to start and stop. °· In the first few days, you may pass some small clots. Let your nurses know if you begin to pass large clots or your flow increases. °· Do not  flush blood clots down the toilet before having the nurse look at them. °· During the next 3-10 days after delivery, your flow should become more watery and pink or brown-tinged in color. °· Ten to fourteen days after delivery, your flow should be a small amount of yellowish-white discharge. °· The amount of your flow will decrease over the first few weeks after delivery. Your flow may stop in 6-8 weeks. Most women have had their flow stop by 12 weeks after delivery. °· You should change your sanitary pads frequently. °· Wash your hands thoroughly with soap and water for at least 20 seconds after changing pads, using  the toilet, or before holding or feeding your newborn. °· You should feel like you need to empty your bladder within the first 6-8 hours after delivery. °· In case you become weak, lightheaded, or faint, call your nurse before you get out of bed for the first time and before you take a shower for the first time. °· Within the first few days after delivery, your breasts may begin to feel tender and full. This is called engorgement. Breast tenderness usually goes away within 48-72 hours after engorgement occurs. You may also notice milk leaking from your breasts. If you are not breastfeeding, do not stimulate your breasts. Breast stimulation can make your breasts produce more milk. °· Spending as much time as possible with your newborn is very important. During this time, you and your newborn can feel close and get to know each other. Having your newborn stay in your room (rooming in) will help to strengthen the bond with your newborn.  It will give you time to get to know your newborn and become comfortable caring for your newborn. °· Your hormones change after delivery. Sometimes the hormone changes can temporarily cause you to feel sad or tearful. These feelings should not last more than a few days. If these feelings last longer than that, you should talk to your caregiver. °· If desired, talk to your caregiver about methods of family planning or contraception. °·   Talk to your caregiver about immunizations. Your caregiver may want you to have the following immunizations before leaving the hospital: °· Tetanus, diphtheria, and pertussis (Tdap) or tetanus and diphtheria (Td) immunization. It is very important that you and your family (including grandparents) or others caring for your newborn are up-to-date with the Tdap or Td immunizations. The Tdap or Td immunization can help protect your newborn from getting ill. °· Rubella immunization. °· Varicella (chickenpox) immunization. °· Influenza immunization. You should  receive this annual immunization if you did not receive the immunization during your pregnancy. °  °This information is not intended to replace advice given to you by your health care provider. Make sure you discuss any questions you have with your health care provider. °  °Document Released: 07/14/2007 Document Revised: 06/10/2012 Document Reviewed: 05/13/2012 °Elsevier Interactive Patient Education ©2016 [employer] ° °Iron Deficiency Anemia, Adult °Anemia is a condition in which there are less red blood cells or hemoglobin in the blood than normal. Hemoglobin is the part of red blood cells that carries oxygen. Iron deficiency anemia is anemia caused by too little iron. It is the most common type of anemia. It may leave you tired and short of breath. °CAUSES  °· Lack of iron in the diet. °· Poor absorption of iron, as seen with intestinal disorders. °· Intestinal bleeding. °· Heavy periods. °SIGNS AND SYMPTOMS  °Mild anemia may not be noticeable. Symptoms may include: °· Fatigue. °· Headache. °· Pale skin. °· Weakness. °· Tiredness. °· Shortness of breath. °· Dizziness. °· Cold hands and feet. °· Fast or irregular heartbeat. °DIAGNOSIS  °Diagnosis requires a thorough evaluation and physical exam by your health care provider. Blood tests are generally used to confirm iron deficiency anemia. Additional tests may be done to find the underlying cause of your anemia. These may include: °· Testing for blood in the stool (fecal occult blood test). °· A procedure to see inside the colon and rectum (colonoscopy). °· A procedure to see inside the esophagus and stomach (endoscopy). °TREATMENT  °Iron deficiency anemia is treated by correcting the cause of the deficiency. Treatment may involve: °· Adding iron-rich foods to your diet. °· Taking iron supplements. Pregnant or breastfeeding women need to take extra iron because their normal diet usually does not provide the required amount. °· Taking vitamins. Vitamin C improves  the absorption of iron. Your health care provider may recommend that you take your iron tablets with a glass of orange juice or vitamin C supplement. °· Medicines to make heavy menstrual flow lighter. °· Surgery. °HOME CARE INSTRUCTIONS  °· Take iron as directed by your health care provider. °¨ If you cannot tolerate taking iron supplements by mouth, talk to your health care provider about taking them through a vein (intravenously) or an injection into a muscle. °¨ For the best iron absorption, iron supplements should be taken on an empty stomach. If you cannot tolerate them on an empty stomach, you may need to take them with food. °¨ Do not drink milk or take antacids at the same time as your iron supplements. Milk and antacids may interfere with the absorption of iron. °¨ Iron supplements can cause constipation. Make sure to include fiber in your diet to prevent constipation. A stool softener may also be recommended. °· Take vitamins as directed by your health care provider. °· Eat a diet rich in iron. Foods high in iron include liver, lean beef, whole-grain bread, eggs, dried fruit, and dark green leafy vegetables. °SEEK IMMEDIATE   MEDICAL CARE IF:  °· You faint. If this happens, do not drive. Call your local emergency services (911 in [place] if no other help is available. °· You have chest pain. °· You feel nauseous or vomit. °· You have severe or increased shortness of breath with activity. °· You feel weak. °· You have a rapid heartbeat. °· You have unexplained sweating. °· You become light-headed when getting up from a chair or bed. °MAKE SURE YOU:  °· Understand these instructions. °· Will watch your condition. °· Will get help right away if you are not doing well or get worse. °  °This information is not intended to replace advice given to you by your health care provider. Make sure you discuss any questions you have with your health care provider. °  °Document Released: 09/13/2000 Document Revised:  10/07/2014 Document Reviewed: 05/24/2013 °Elsevier Interactive Patient Education ©2016 Elsevier Inc. ° °

## 2018-05-09 ENCOUNTER — Inpatient Hospital Stay (HOSPITAL_COMMUNITY)
Admission: AD | Admit: 2018-05-09 | Discharge: 2018-05-09 | Disposition: A | Discharge status: Home / Self Care | Payer: Self-pay | Source: Ambulatory Visit | Attending: Family Medicine | Admitting: Family Medicine

## 2018-05-09 ENCOUNTER — Other Ambulatory Visit: Payer: Self-pay

## 2018-05-09 ENCOUNTER — Encounter (HOSPITAL_COMMUNITY): Payer: Self-pay

## 2018-05-09 DIAGNOSIS — B9689 Other specified bacterial agents as the cause of diseases classified elsewhere: Secondary | ICD-10-CM | POA: Insufficient documentation

## 2018-05-09 DIAGNOSIS — Z79899 Other long term (current) drug therapy: Secondary | ICD-10-CM | POA: Insufficient documentation

## 2018-05-09 DIAGNOSIS — N3001 Acute cystitis with hematuria: Secondary | ICD-10-CM | POA: Insufficient documentation

## 2018-05-09 LAB — WET PREP, GENITAL
Clue Cells Wet Prep HPF POC: NONE SEEN
Sperm: NONE SEEN
Trich, Wet Prep: NONE SEEN
Yeast Wet Prep HPF POC: NONE SEEN

## 2018-05-09 LAB — URINALYSIS, ROUTINE W REFLEX MICROSCOPIC
Bilirubin Urine: NEGATIVE
GLUCOSE, UA: NEGATIVE mg/dL
KETONES UR: 20 mg/dL — AB
NITRITE: NEGATIVE
PROTEIN: 100 mg/dL — AB
Specific Gravity, Urine: 1.028 (ref 1.005–1.030)
pH: 5 (ref 5.0–8.0)

## 2018-05-09 LAB — POCT PREGNANCY, URINE: Preg Test, Ur: NEGATIVE

## 2018-05-09 MED ORDER — CIPROFLOXACIN HCL 500 MG PO TABS: 500.0000 mg | ORAL_TABLET | Freq: Two times a day (BID) | ORAL | 0 refills | Status: DC

## 2018-05-09 MED ORDER — PHENAZOPYRIDINE HCL 95 MG PO TABS: 95.0000 mg | ORAL_TABLET | Freq: Three times a day (TID) | ORAL | 0 refills | Status: DC | PRN

## 2018-05-09 NOTE — MAU Note (Signed)
Saw some blood in toilet when urinating, having some burning, frequency, and having trouble emptying bladder completely

## 2018-05-09 NOTE — MAU Provider Note (Addendum)
Patient Valerie [name] is a 27 y.o. [phone] At Unknown here with complaints of urinary frequency, dysuria and pelvic pain for the past three days. She denies vaginal bleeding, abnormal discharge, pain with intercourse or other ob-gyn complaint. She denies chills, body ache, fever.   History     CSN: [id number]  Arrival date and time: 05/09/18 1430   First Provider Initiated Contact with Patient 05/09/18 1720      Chief Complaint  Patient presents with  . Dysuria  . Urinary Frequency   Dysuria   This is a new problem. The current episode started in the past 7 days. The problem occurs every urination. There has been no fever. Associated symptoms include frequency and urgency. Pertinent negatives include no chills or discharge.  Urinary Frequency   This is a new problem. The current episode started in the past 7 days. The problem occurs every urination. Associated symptoms include frequency and urgency. Pertinent negatives include no chills or discharge.    OB History    Gravida  3   Para  3   Term  3   Preterm  0   AB  0   Living  3     SAB  0   TAB  0   Ectopic  0   Multiple  0   Live Births  2           Past Medical History:  Diagnosis Date  . Medical history non-contributory   . No pertinent past medical history     Past Surgical History:  Procedure Laterality Date  . APPENDECTOMY    . LAPAROSCOPIC APPENDECTOMY  08/21/2012   Procedure: APPENDECTOMY LAPAROSCOPIC;  Surgeon: Mariella Saa, MD;  Location: [facility];  Service: General;  Laterality: N/A;    History reviewed. No pertinent family history.  Social History   Tobacco Use  . Smoking status: Never Smoker  . Smokeless tobacco: Never Used  Substance Use Topics  . Alcohol use: No  . Drug use: No    Allergies: No Known Allergies  Medications Prior to Admission  Medication Sig Dispense Refill Last Dose  . ferrous sulfate 325 (65 FE) MG tablet Take 1 tablet (325 mg total) by mouth 2  (two) times daily with a meal. 60 tablet 3   . ibuprofen (ADVIL,MOTRIN) 600 MG tablet Take 1 tablet (600 mg total) by mouth every 6 (six) hours as needed. 30 tablet 1   . Prenatal Vit w/Fe-Methylfol-FA (PNV PO) Take by mouth.   Past Week at Unknown time    Review of Systems  Constitutional: Negative for chills.  HENT: Negative.   Respiratory: Negative.   Cardiovascular: Negative.   Gastrointestinal: Negative.   Genitourinary: Positive for dysuria, frequency and urgency.  Neurological: Negative.   Psychiatric/Behavioral: Negative.    Physical Exam   Blood pressure 118/65, pulse (!) 105, temperature 98.1 F (36.7 C), temperature source Oral, resp. rate 18, weight 48.5 kg, last menstrual period 04/26/2018, unknown if currently breastfeeding.  Physical Exam  Constitutional: She is oriented to person, place, and time. She appears well-developed and well-nourished.  HENT:  Head: Normocephalic.  Respiratory: Effort normal.  GI: Soft. Bowel sounds are normal.  Genitourinary:  Genitourinary Comments: Normal external female genitalia; no CMT, suprapubic tenderness or adnexal tenderness.   Musculoskeletal: Normal range of motion.  Neurological: She is alert and oriented to person, place, and time.  Skin: Skin is warm.  Psychiatric: She has a normal mood and affect.  Negative for CVAT.  MAU Course  Procedures  MDM -Patient is fasting; could not give urine specimen until late in her MAU stay.  -Urine culture sent, gc chlamydia pending -wet prep negative, UPT negative.   Assessment and Plan   1. Acute cystitis with hematuria    2. Patient stable for discharge with UA pending; will send for urine culture and treat with Cipro prophylaxis.   3. RX for Cipro given plus pyridium for pain; warning signs and when to return to MAU reviewed.   Charlesetta GaribaldiKathryn Lorraine Jessie Schrieber 05/09/2018, 6:45 PM

## 2018-05-09 NOTE — MAU Note (Signed)
Pt had only enough urine for pregnancy test.

## 2018-05-09 NOTE — Progress Notes (Signed)
D/c instructions given with pt understanding pt left unit via ambulatory.

## 2018-05-09 NOTE — Discharge Instructions (Signed)

## 2018-05-11 LAB — GC/CHLAMYDIA PROBE AMP (~~LOC~~) NOT AT ARMC
CHLAMYDIA, DNA PROBE: NEGATIVE
Neisseria Gonorrhea: NEGATIVE

## 2018-05-12 LAB — URINE CULTURE: Culture: 60000 — AB

## 2020-04-17 ENCOUNTER — Ambulatory Visit (INDEPENDENT_AMBULATORY_CARE_PROVIDER_SITE_OTHER): Payer: Self-pay | Admitting: Primary Care

## 2020-04-17 ENCOUNTER — Other Ambulatory Visit: Payer: Self-pay

## 2020-04-17 ENCOUNTER — Encounter (INDEPENDENT_AMBULATORY_CARE_PROVIDER_SITE_OTHER): Payer: Self-pay | Admitting: Primary Care

## 2020-04-17 ENCOUNTER — Other Ambulatory Visit (HOSPITAL_COMMUNITY)
Admission: RE | Admit: 2020-04-17 | Discharge: 2020-04-17 | Disposition: A | Discharge status: Home / Self Care | Payer: Medicaid Other | Source: Ambulatory Visit | Attending: Primary Care | Admitting: Primary Care

## 2020-04-17 VITALS — BP 101/66 | HR 75 | Temp 97.7°F | Ht 63.5 in | Wt 110.0 lb

## 2020-04-17 DIAGNOSIS — N898 Other specified noninflammatory disorders of vagina: Secondary | ICD-10-CM | POA: Insufficient documentation

## 2020-04-17 DIAGNOSIS — Z7689 Persons encountering health services in other specified circumstances: Secondary | ICD-10-CM

## 2020-04-17 DIAGNOSIS — Z1159 Encounter for screening for other viral diseases: Secondary | ICD-10-CM

## 2020-04-17 NOTE — Progress Notes (Signed)
New Patient Office Visit  Subjective:  Patient ID: Valerie [name], female    DOB: 07-Jun-1991  Age: 29 y.o. MRN: [id number]  CC:  Chief Complaint  Patient presents with  . New Patient (Initial Visit)    Pt. is here to establish care.     HPI Valerie [name] is a 29 year old  presents for establishment of care. She is concern with vaginal discharge.  Past Medical History:  Diagnosis Date  . Medical history non-contributory   . No pertinent past medical history     Past Surgical History:  Procedure Laterality Date  . APPENDECTOMY    . LAPAROSCOPIC APPENDECTOMY  08/21/2012   Procedure: APPENDECTOMY LAPAROSCOPIC;  Surgeon: Mariella Saa, MD;  Location: [facility];  Service: General;  Laterality: N/A;    Family History  Problem Relation Age of Onset  . Hypertension Neg Hx     Social History   Socioeconomic History  . Marital status: Married    Spouse name: Not on file  . Number of children: Not on file  . Years of education: Not on file  . Highest education level: Not on file  Occupational History  . Not on file  Tobacco Use  . Smoking status: Never Smoker  . Smokeless tobacco: Never Used  Substance and Sexual Activity  . Alcohol use: No  . Drug use: No  . Sexual activity: Yes    Birth control/protection: I.U.D.  Other Topics Concern  . Not on file  Social History Narrative  . Not on file   Social Determinants of Health   Financial Resource Strain:   . Difficulty of Paying Living Expenses:   Food Insecurity:   . Worried About Programme researcher, broadcasting/film/video in the Last Year:   . Barista in the Last Year:   Transportation Needs:   . Freight forwarder (Medical):   Marland Kitchen Lack of Transportation (Non-Medical):   Physical Activity:   . Days of Exercise per Week:   . Minutes of Exercise per Session:   Stress:   . Feeling of Stress :   Social Connections:   . Frequency of Communication with Friends and Family:   . Frequency of Social Gatherings with  Friends and Family:   . Attends Religious Services:   . Active Member of Clubs or Organizations:   . Attends Banker Meetings:   Marland Kitchen Marital Status:   Intimate Partner Violence:   . Fear of Current or Ex-Partner:   . Emotionally Abused:   Marland Kitchen Physically Abused:   . Sexually Abused:     ROS Review of Systems  Genitourinary: Positive for vaginal discharge.  All other systems reviewed and are negative.   Objective:   Today's Vitals: BP 101/66 (BP Location: Left Arm, Patient Position: Sitting, Cuff Size: Normal)   Pulse 75   Temp 97.7 F (36.5 C) (Temporal)   Ht 5' 3.5" (1.613 m)   Wt 110 lb (49.9 kg)   SpO2 100%   Breastfeeding No   BMI 19.18 kg/m   Physical Exam Vitals reviewed.  Constitutional:      Appearance: Normal appearance.  HENT:     Head: Normocephalic.     Right Ear: Tympanic membrane normal.     Left Ear: Tympanic membrane normal.     Nose: Nose normal.  Cardiovascular:     Rate and Rhythm: Normal rate and regular rhythm.     Pulses: Normal pulses.     Heart  sounds: Normal heart sounds.  Pulmonary:     Effort: Pulmonary effort is normal.     Breath sounds: Normal breath sounds.  Abdominal:     General: Abdomen is flat. Bowel sounds are normal.  Musculoskeletal:        General: Normal range of motion.     Cervical back: Normal range of motion.  Skin:    General: Skin is warm and dry.  Neurological:     Mental Status: She is alert and oriented to person, place, and time.  Psychiatric:        Mood and Affect: Mood normal.        Behavior: Behavior normal.        Thought Content: Thought content normal.        Judgment: Judgment normal.     Assessment & Plan:  Valerie [name] was seen today for new patient (initial visit).  Diagnoses and all orders for this visit:  Vaginal discharge -     Cervicovaginal ancillary only  Encounter to establish care Valerie Passe, NP-C will be your  (PCP) she is mastered prepared . She is skilled to  diagnosed and treat illness. Also able to answer health concern as well as continuing care of varied medical conditions, not limited by cause, organ system, or diagnosis.   -     CBC -     Comprehensive metabolic panel  Need for hepatitis C screening test Health maintenance gap -     Hepatitis C Antibody    Outpatient Encounter Medications as of 04/17/2020  Medication Sig  . ciprofloxacin (CIPRO) 500 MG tablet Take 1 tablet (500 mg total) by mouth 2 (two) times daily. (Patient not taking: Reported on 04/17/2020)  . ferrous sulfate 325 (65 FE) MG tablet Take 1 tablet (325 mg total) by mouth 2 (two) times daily with a meal. (Patient not taking: Reported on 04/17/2020)  . ibuprofen (ADVIL,MOTRIN) 600 MG tablet Take 1 tablet (600 mg total) by mouth every 6 (six) hours as needed. (Patient not taking: Reported on 04/17/2020)  . phenazopyridine (PYRIDIUM) 95 MG tablet Take 1 tablet (95 mg total) by mouth 3 (three) times daily as needed for pain. (Patient not taking: Reported on 04/17/2020)  . Prenatal Vit w/Fe-Methylfol-FA (PNV PO) Take by mouth. (Patient not taking: Reported on 04/17/2020)   No facility-administered encounter medications on file as of 04/17/2020.    Follow-up: Return for schedule for pap.   Grayce Sessions, NP

## 2020-04-17 NOTE — Patient Instructions (Signed)
Vaginitis Vaginitis is a condition in which the vaginal tissue swells and becomes red (inflamed). This condition is most often caused by a change in the normal balance of bacteria and yeast that live in the vagina. This change causes an overgrowth of certain bacteria or yeast, which causes the inflammation. There are different types of vaginitis, but the most common types are:  Bacterial vaginosis.  Yeast infection (candidiasis).  Trichomoniasis vaginitis. This is a sexually transmitted disease (STD).  Viral vaginitis.  Atrophic vaginitis.  Allergic vaginitis. What are the causes? The cause of this condition depends on the type of vaginitis. It can be caused by:  Bacteria (bacterial vaginosis).  Yeast, which is a fungus (yeast infection).  A parasite (trichomoniasis vaginitis).  A virus (viral vaginitis).  Low hormone levels (atrophic vaginitis). Low hormone levels can occur during pregnancy, breastfeeding, or after menopause.  Irritants, such as bubble baths, scented tampons, and feminine sprays (allergic vaginitis). Other factors can change the normal balance of the yeast and bacteria that live in the vagina. These include:  Antibiotic medicines.  Poor hygiene.  Diaphragms, vaginal sponges, spermicides, birth control pills, and intrauterine devices (IUD).  Sex.  Infection.  Uncontrolled diabetes.  A weakened defense (immune) system. What increases the risk? This condition is more likely to develop in women who:  Smoke.  Use vaginal douches, scented tampons, or scented sanitary pads.  Wear tight-fitting pants.  Wear thong underwear.  Use oral birth control pills or an IUD.  Have sex without a condom.  Have multiple sex partners.  Have an STD.  Frequently use the spermicide nonoxynol-9.  Eat lots of foods high in sugar.  Have uncontrolled diabetes.  Have low estrogen levels.  Have a weakened immune system from an immune disorder or medical  treatment.  Are pregnant or breastfeeding. What are the signs or symptoms? Symptoms vary depending on the cause of the vaginitis. Common symptoms include:  Abnormal vaginal discharge. ? The discharge is white, gray, or yellow with bacterial vaginosis. ? The discharge is thick, white, and cheesy with a yeast infection. ? The discharge is frothy and yellow or greenish with trichomoniasis.  A bad vaginal smell. The smell is fishy with bacterial vaginosis.  Vaginal itching, pain, or swelling.  Sex that is painful.  Pain or burning when urinating. Sometimes there are no symptoms. How is this diagnosed? This condition is diagnosed based on your symptoms and medical history. A physical exam, including a pelvic exam, will also be done. You may also have other tests, including:  Tests to determine the pH level (acidity or alkalinity) of your vagina.  A whiff test, to assess the odor that results when a sample of your vaginal discharge is mixed with a potassium hydroxide solution.  Tests of vaginal fluid. A sample will be examined under a microscope. How is this treated? Treatment varies depending on the type of vaginitis you have. Your treatment may include:  Antibiotic creams or pills to treat bacterial vaginosis and trichomoniasis.  Antifungal medicines, such as vaginal creams or suppositories, to treat a yeast infection.  Medicine to ease discomfort if you have viral vaginitis. Your sexual partner should also be treated.  Estrogen delivered in a cream, pill, suppository, or vaginal ring to treat atrophic vaginitis. If vaginal dryness occurs, lubricants and moisturizing creams may help. You may need to avoid scented soaps, sprays, or douches.  Stopping use of a product that is causing allergic vaginitis. Then using a vaginal cream to treat the symptoms. Follow   these instructions at home: Lifestyle  Keep your genital area clean and dry. Avoid soap, and only rinse the area with  water.  Do not douche or use tampons until your health care provider says it is okay to do so. Use sanitary pads, if needed.  Do not have sex until your health care provider approves. When you can return to sex, practice safe sex and use condoms.  Wipe from front to back. This avoids the spread of bacteria from the rectum to the vagina. General instructions  Take over-the-counter and prescription medicines only as told by your health care provider.  If you were prescribed an antibiotic medicine, take or use it as told by your health care provider. Do not stop taking or using the antibiotic even if you start to feel better.  Keep all follow-up visits as told by your health care provider. This is important. How is this prevented?  Use mild, non-scented products. Do not use things that can irritate the vagina, such as fabric softeners. Avoid the following products if they are scented: ? Feminine sprays. ? Detergents. ? Tampons. ? Feminine hygiene products. ? Soaps or bubble baths.  Let air reach your genital area. ? Wear cotton underwear to reduce moisture buildup. ? Avoid wearing underwear while you sleep. ? Avoid wearing tight pants and underwear or nylons without a cotton panel. ? Avoid wearing thong underwear.  Take off any wet clothing, such as bathing suits, as soon as possible.  Practice safe sex and use condoms. Contact a health care provider if:  You have abdominal pain.  You have a fever.  You have symptoms that last for more than 2-3 days. Get help right away if:  You have a fever and your symptoms suddenly get worse. Summary  Vaginitis is a condition in which the vaginal tissue becomes inflamed.This condition is most often caused by a change in the normal balance of bacteria and yeast that live in the vagina.  Treatment varies depending on the type of vaginitis you have.  Do not douche, use tampons , or have sex until your health care provider approves. When  you can return to sex, practice safe sex and use condoms. This information is not intended to replace advice given to you by your health care provider. Make sure you discuss any questions you have with your health care provider. Document Revised: 08/29/2017 Document Reviewed: 10/22/2016 Elsevier Patient Education  2020 [employer]  

## 2020-04-18 LAB — COMPREHENSIVE METABOLIC PANEL
ALT: 7 IU/L (ref 0–32)
AST: 15 IU/L (ref 0–40)
Albumin/Globulin Ratio: 2.1 (ref 1.2–2.2)
Albumin: 4.6 g/dL (ref 3.9–5.0)
Alkaline Phosphatase: 38 IU/L — ABNORMAL LOW (ref 48–121)
BUN/Creatinine Ratio: 23 (ref 9–23)
BUN: 13 mg/dL (ref 6–20)
Bilirubin Total: 0.4 mg/dL (ref 0.0–1.2)
CO2: 22 mmol/L (ref 20–29)
Calcium: 9.2 mg/dL (ref 8.7–10.2)
Chloride: 105 mmol/L (ref 96–106)
Creatinine, Ser: 0.56 mg/dL — ABNORMAL LOW (ref 0.57–1.00)
GFR calc Af Amer: 148 mL/min/{1.73_m2} (ref >59)
GFR calc non Af Amer: 128 mL/min/{1.73_m2} (ref >59)
Globulin, Total: 2.2 g/dL (ref 1.5–4.5)
Glucose: 88 mg/dL (ref 65–99)
Potassium: 4.3 mmol/L (ref 3.5–5.2)
Sodium: 140 mmol/L (ref 134–144)
Total Protein: 6.8 g/dL (ref 6.0–8.5)

## 2020-04-18 LAB — CBC
Hematocrit: 36.8 % (ref 34.0–46.6)
Hemoglobin: 12.2 g/dL (ref 11.1–15.9)
MCH: 26.7 pg (ref 26.6–33.0)
MCHC: 33.2 g/dL (ref 31.5–35.7)
MCV: 81 fL (ref 79–97)
Platelets: 193 10*3/uL (ref 150–450)
RBC: 4.57 x10E6/uL (ref 3.77–5.28)
RDW: 12.7 % (ref 11.7–15.4)
WBC: 3.7 10*3/uL (ref 3.4–10.8)

## 2020-04-18 LAB — CERVICOVAGINAL ANCILLARY ONLY
Bacterial Vaginitis (gardnerella): NEGATIVE
Candida Glabrata: NEGATIVE
Candida Vaginitis: NEGATIVE
Chlamydia: NEGATIVE
Comment: NEGATIVE
Comment: NEGATIVE
Comment: NEGATIVE
Comment: NEGATIVE
Comment: NEGATIVE
Comment: NORMAL
Neisseria Gonorrhea: NEGATIVE
Trichomonas: NEGATIVE

## 2020-04-18 LAB — HEPATITIS C ANTIBODY: Hep C Virus Ab: <0.1 s/co ratio (ref 0.0–0.9)

## 2020-04-25 ENCOUNTER — Ambulatory Visit (INDEPENDENT_AMBULATORY_CARE_PROVIDER_SITE_OTHER): Payer: Medicaid Other | Admitting: Primary Care

## 2020-05-02 ENCOUNTER — Encounter (INDEPENDENT_AMBULATORY_CARE_PROVIDER_SITE_OTHER): Payer: Self-pay | Admitting: Primary Care

## 2020-05-02 ENCOUNTER — Ambulatory Visit (INDEPENDENT_AMBULATORY_CARE_PROVIDER_SITE_OTHER): Payer: Self-pay | Admitting: Primary Care

## 2020-05-02 ENCOUNTER — Other Ambulatory Visit (HOSPITAL_COMMUNITY)
Admission: RE | Admit: 2020-05-02 | Discharge: 2020-05-02 | Disposition: A | Discharge status: Home / Self Care | Payer: Medicaid Other | Source: Ambulatory Visit | Attending: Primary Care | Admitting: Primary Care

## 2020-05-02 ENCOUNTER — Other Ambulatory Visit: Payer: Self-pay

## 2020-05-02 VITALS — BP 121/71 | HR 76 | Temp 97.9°F | Ht 63.0 in | Wt 109.4 lb

## 2020-05-02 DIAGNOSIS — Z01419 Encounter for gynecological examination (general) (routine) without abnormal findings: Secondary | ICD-10-CM

## 2020-05-02 NOTE — Patient Instructions (Signed)

## 2020-05-02 NOTE — Progress Notes (Signed)
Subjective:     Valerie [name] is a 29 y.o. female and is here for a comprehensive physical exam. The patient reports no problems.  Social History   Socioeconomic History  . Marital status: Married    Spouse name: Not on file  . Number of children: Not on file  . Years of education: Not on file  . Highest education level: Not on file  Occupational History  . Not on file  Tobacco Use  . Smoking status: Never Smoker  . Smokeless tobacco: Never Used  Substance and Sexual Activity  . Alcohol use: No  . Drug use: No  . Sexual activity: Yes    Birth control/protection: I.U.D.  Other Topics Concern  . Not on file  Social History Narrative  . Not on file   Social Determinants of Health   Financial Resource Strain:   . Difficulty of Paying Living Expenses:   Food Insecurity:   . Worried About Programme researcher, broadcasting/film/video in the Last Year:   . Barista in the Last Year:   Transportation Needs:   . Freight forwarder (Medical):   Marland Kitchen Lack of Transportation (Non-Medical):   Physical Activity:   . Days of Exercise per Week:   . Minutes of Exercise per Session:   Stress:   . Feeling of Stress :   Social Connections:   . Frequency of Communication with Friends and Family:   . Frequency of Social Gatherings with Friends and Family:   . Attends Religious Services:   . Active Member of Clubs or Organizations:   . Attends Banker Meetings:   Marland Kitchen Marital Status:   Intimate Partner Violence:   . Fear of Current or Ex-Partner:   . Emotionally Abused:   Marland Kitchen Physically Abused:   . Sexually Abused:    Health Maintenance  Topic Date Due  . PAP-Cervical Cytology Screening  Never done  . PAP SMEAR-Modifier  Never done  . INFLUENZA VACCINE  04/30/2020  . TETANUS/TDAP  07/01/2024  . Hepatitis C Screening  Completed  . HIV Screening  Completed     Review of Systems   Review of Systems  All other systems reviewed and are negative.   Objective:  BP 121/71 (BP  Location: Right Arm, Patient Position: Sitting, Cuff Size: Small)   Pulse 76   Temp 97.9 F (36.6 C) (Oral)   Ht 5\' 3"  (1.6 m)   Wt 109 lb 6.4 oz (49.6 kg)   SpO2 100%   BMI 19.38 kg/m    Assessment:    Healthy female exam.   CONSTITUTIONAL: Well-developed, well-nourished thin frame female in no acute distress.  HENT:  Normocephalic, atraumatic, External right and left ear normal. EYES: Conjunctivae and EOM are normal. Pupils are equal, round, and reactive to light. No scleral icterus.  NECK: Normal range of motion, supple, no masses.  Normal thyroid.  SKIN: Skin is warm and dry. No rash noted. Not diaphoretic. No erythema. No pallor. NEUROLGIC: Alert and oriented to person, place, and time. Normal reflexes, muscle tone coordination. No cranial nerve deficit noted. PSYCHIATRIC: Normal mood and affect. Normal behavior. Normal judgment and thought content. CARDIOVASCULAR: Normal heart rate noted, regular rhythm RESPIRATORY: Clear to auscultation bilaterally. Effort and breath sounds normal, no problems with respiration noted. BREASTS: Taught SBE ABDOMEN: Soft, normal bowel sounds, no distention noted.  No tenderness, rebound or guarding.  PELVIC: Normal appearing external genitalia; normal appearing vaginal mucosa and cervix.  No abnormal discharge noted.  Pap smear obtained.  Normal uterine size, no other palpable masses, no uterine or adnexal tenderness. MUSCULOSKELETAL: Normal range of motion. No tenderness.  No cyanosis, clubbing, or edema.  2+ distal pulses. Plan:  Teia was seen today for gynecologic exam.  Diagnoses and all orders for this visit:  Encounter for gynecological examination without abnormal finding -     Cytology - PAP ([place])     See After Visit Summary for Counseling Recommendations

## 2020-05-03 LAB — CYTOLOGY - PAP: Diagnosis: NEGATIVE

## 2020-05-05 ENCOUNTER — Telehealth (INDEPENDENT_AMBULATORY_CARE_PROVIDER_SITE_OTHER): Payer: Self-pay

## 2020-05-05 NOTE — Telephone Encounter (Signed)
Patient aware of normal pap. Maryjean Morn, CMA

## 2020-05-05 NOTE — Telephone Encounter (Signed)
-----   Message from Grayce Sessions, NP sent at 05/04/2020 11:39 PM EDT ----- Cytology Pap normal will need to repeat in 3 years

## 2021-01-26 ENCOUNTER — Emergency Department (HOSPITAL_COMMUNITY): Payer: Medicaid Other

## 2021-01-26 ENCOUNTER — Emergency Department (HOSPITAL_COMMUNITY)
Admission: EM | Admit: 2021-01-26 | Discharge: 2021-01-26 | Disposition: A | Discharge status: Home / Self Care | Payer: Medicaid Other | Attending: Emergency Medicine | Admitting: Emergency Medicine

## 2021-01-26 ENCOUNTER — Other Ambulatory Visit: Payer: Self-pay

## 2021-01-26 ENCOUNTER — Encounter (HOSPITAL_COMMUNITY): Payer: Self-pay | Admitting: Emergency Medicine

## 2021-01-26 DIAGNOSIS — Y92410 Unspecified street and highway as the place of occurrence of the external cause: Secondary | ICD-10-CM | POA: Insufficient documentation

## 2021-01-26 DIAGNOSIS — Z041 Encounter for examination and observation following transport accident: Secondary | ICD-10-CM | POA: Diagnosis present

## 2021-01-26 LAB — CBC
HCT: 36.4 % (ref 36.0–46.0)
Hemoglobin: 11.6 g/dL — ABNORMAL LOW (ref 12.0–15.0)
MCH: 26.1 pg (ref 26.0–34.0)
MCHC: 31.9 g/dL (ref 30.0–36.0)
MCV: 81.8 fL (ref 80.0–100.0)
Platelets: 154 10*3/uL (ref 150–400)
RBC: 4.45 MIL/uL (ref 3.87–5.11)
RDW: 13.4 % (ref 11.5–15.5)
WBC: 3.8 10*3/uL — ABNORMAL LOW (ref 4.0–10.5)
nRBC: 0 % (ref 0.0–0.2)

## 2021-01-26 LAB — COMPREHENSIVE METABOLIC PANEL
ALT: 9 U/L (ref 0–44)
AST: 16 U/L (ref 15–41)
Albumin: 4 g/dL (ref 3.5–5.0)
Alkaline Phosphatase: 30 U/L — ABNORMAL LOW (ref 38–126)
Anion gap: 9 (ref 5–15)
BUN: 10 mg/dL (ref 6–20)
CO2: 18 mmol/L — ABNORMAL LOW (ref 22–32)
Calcium: 8.7 mg/dL — ABNORMAL LOW (ref 8.9–10.3)
Chloride: 109 mmol/L (ref 98–111)
Creatinine, Ser: 0.59 mg/dL (ref 0.44–1.00)
GFR, Estimated: >60 mL/min (ref >60)
Glucose, Bld: 100 mg/dL — ABNORMAL HIGH (ref 70–99)
Potassium: 3.5 mmol/L (ref 3.5–5.1)
Sodium: 136 mmol/L (ref 135–145)
Total Bilirubin: 0.9 mg/dL (ref 0.3–1.2)
Total Protein: 6.6 g/dL (ref 6.5–8.1)

## 2021-01-26 LAB — URINALYSIS, ROUTINE W REFLEX MICROSCOPIC
Bilirubin Urine: NEGATIVE
Glucose, UA: NEGATIVE mg/dL
Hgb urine dipstick: NEGATIVE
Ketones, ur: 5 mg/dL — AB
Leukocytes,Ua: NEGATIVE
Nitrite: NEGATIVE
Protein, ur: NEGATIVE mg/dL
Specific Gravity, Urine: >1.046 — ABNORMAL HIGH (ref 1.005–1.030)
pH: 6 (ref 5.0–8.0)

## 2021-01-26 LAB — PROTIME-INR
INR: 1.1 (ref 0.8–1.2)
Prothrombin Time: 14.3 seconds (ref 11.4–15.2)

## 2021-01-26 LAB — SAMPLE TO BLOOD BANK

## 2021-01-26 LAB — I-STAT CHEM 8, ED
BUN: 10 mg/dL (ref 6–20)
Calcium, Ion: 1.03 mmol/L — ABNORMAL LOW (ref 1.15–1.40)
Chloride: 110 mmol/L (ref 98–111)
Creatinine, Ser: 0.5 mg/dL (ref 0.44–1.00)
Glucose, Bld: 96 mg/dL (ref 70–99)
HCT: 35 % — ABNORMAL LOW (ref 36.0–46.0)
Hemoglobin: 11.9 g/dL — ABNORMAL LOW (ref 12.0–15.0)
Potassium: 3.4 mmol/L — ABNORMAL LOW (ref 3.5–5.1)
Sodium: 139 mmol/L (ref 135–145)
TCO2: 19 mmol/L — ABNORMAL LOW (ref 22–32)

## 2021-01-26 LAB — I-STAT BETA HCG BLOOD, ED (MC, WL, AP ONLY): I-stat hCG, quantitative: <5 m[IU]/mL (ref <5)

## 2021-01-26 LAB — ETHANOL: Alcohol, Ethyl (B): <10 mg/dL (ref <10)

## 2021-01-26 LAB — LACTIC ACID, PLASMA: Lactic Acid, Venous: 1.8 mmol/L (ref 0.5–1.9)

## 2021-01-26 MED ORDER — IOHEXOL 300 MG/ML  SOLN
100.0000 mL | Freq: Once | INTRAMUSCULAR | Status: AC | PRN
  Administered 2021-01-26: 100 mL via INTRAVENOUS

## 2021-01-26 MED ORDER — SODIUM CHLORIDE 0.9 % IV BOLUS
500.0000 mL | Freq: Once | INTRAVENOUS | Status: AC
  Administered 2021-01-26: 500 mL via INTRAVENOUS

## 2021-01-26 NOTE — ED Notes (Signed)
Reviewed discharge instructions with patient and spouse. Follow-up care reviewed. Patient and spouse verbalized understanding. Patient A&Ox4, VSS, and ambulatory with steady gait upon discharge. 
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
## 2021-01-26 NOTE — ED Notes (Signed)
Pt unable to produce urine specimen at this time. Pt is on a purewick
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
## 2021-01-26 NOTE — ED Notes (Signed)
Pt ambulated w/ standby assistance to bathroom. Pt initially dizzy but reported dizziness improved once standing up and walking. Pt tolerated well.

## 2021-01-26 NOTE — ED Notes (Signed)
Pt back in room from CT 

## 2021-01-29 ENCOUNTER — Telehealth: Payer: Self-pay

## 2021-01-29 NOTE — Telephone Encounter (Signed)
Interpreter ID: [id number]  Transition Care Management Follow-up Telephone Call  Date of discharge and from where: 01/26/2021 from [facility]  How have you been since you were released from the hospital? Pt stated that she is feeling much better today.   Any questions or concerns? No  Items Reviewed:  Did the pt receive and understand the discharge instructions provided? Yes   Medications obtained and verified? Yes   Other? No   Any new allergies since your discharge? No   Dietary orders reviewed? n/a  Do you have support at home? Yes   Functional Questionnaire: (I = Independent and D = Dependent) ADLs: I  Bathing/Dressing- I  Meal Prep- I  Eating- I  Maintaining continence- I  Transferring/Ambulation- I  Managing Meds- I   Follow up appointments reviewed:   PCP Hospital f/u appt confirmed? No    Specialist Hospital f/u appt confirmed? No    Are transportation arrangements needed? No   If their condition worsens, is the pt aware to call PCP or go to the Emergency Dept.? Yes  Was the patient provided with contact information for the PCP's office or ED? Yes  Was to pt encouraged to call back with questions or concerns? Yes

## 2022-05-10 LAB — OB RESULTS CONSOLE HEPATITIS B SURFACE ANTIGEN: Hepatitis B Surface Ag: NEGATIVE

## 2022-06-10 ENCOUNTER — Ambulatory Visit: Payer: Medicaid Other | Attending: Obstetrics and Gynecology

## 2022-06-10 ENCOUNTER — Ambulatory Visit: Payer: Self-pay | Admitting: Genetics

## 2022-06-10 DIAGNOSIS — D563 Thalassemia minor: Secondary | ICD-10-CM | POA: Diagnosis not present

## 2022-06-11 DIAGNOSIS — D563 Thalassemia minor: Secondary | ICD-10-CM | POA: Insufficient documentation

## 2022-06-11 NOTE — Progress Notes (Addendum)
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   EDC: 09/27/2022 LMP: 12/21/2021 Referring Provider:  Grayce Sessions, NP  EGA: [redacted]w[redacted]d Genetic Counselor: Teena Dunk, MS, CGC  OB Hx: [id number] Date of Appointment: 06/10/2022  Accompanied by: FOB Wayna Chalet) Face to Face Time: 30 Minutes   Previous Testing Completed: Latressa previously completed cell-free DNA screening (cfDNA) in this pregnancy. The result is low risk, consistent with a female fetus. This screening significantly reduces the risk that the current pregnancy has Down syndrome, Trisomy 61, Trisomy 56, and common sex chromosome conditions, however, the risk is not zero given the limitations of cfDNA. Additionally, there are many genetic conditions that cannot be detected by cfDNA.  Fusae previously completed carrier screening. She screened to be a silent carrier for Alpha Thalassemia. She screened to not be a carrier for Cystic Fibrosis (CF), Spinal Muscular Atrophy (SMA), and Beta Hemoglobinopathies. A negative result on carrier screening reduces the likelihood of being a carrier, however, does not entirely rule out the possibility.   Medical History:  This is Blayne's 4th pregnancy. She has 3 living, healthy children. Reports she takes iron. Denies personal history of diabetes, high blood pressure, thyroid conditions, and seizures. Denies bleeding, infections, and fevers in this pregnancy. Denies using tobacco, alcohol, or street drugs in this pregnancy.   Family History: A pedigree was created and scanned into Epic under the Media tab. Maternal and paternal ethnicity reported as [place]. Denies Ashkenazi Jewish ancestry. Family history not remarkable for consanguinity, individuals with birth defects, intellectual  disability, autism spectrum disorder, multiple spontaneous abortions, still births, or unexplained neonatal death.     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 Alpha Thalassemia refers to a group of inherited blood disorders that reduce the amount of hemoglobin, the protein in red blood cells that carries oxygen to tissues throughout the body. Hemoglobin is made up of both alpha globin and beta globin proteins. There are four alpha globin genes that are responsible for making alpha globin. Alpha Thalassemia occurs when three or more of the four alpha globin genes are deleted or changed. Alpha Thalassemia can also occur when a person has specific mutations, called 'point mutations', in two of the four genes. 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 With this result, we know that Hae has three working copies of the alpha-globin genes while her 4th alpha-globin gene is non-functional due to a point mutation. Each of Jennavieve's children will either inherit two functional copies (??) or one functional copy and one non-functional copy (?-) from her. Jamilet is not at an increased risk to have a baby with fetal hydrops due to Hemoglobin Barts disease (--/--) regardless of her reproductive partner's carrier status. If Tamya's reproductive partner is found to be an alpha thalassemia carrier in the cis configuration (??/--) there would be a 25% risk for the current pregnancy to be affected with Hemoglobin H-Point Mutation Disease (--/?-). People with this condition typically have chronic anemia, frequent viral infections, and may have an enlarged spleen. If Tomasina's reproductive partner is found to have one point mutation or the [place] mutation there would be a 25% risk for the current pregnancy to be affected with Homozygous Point  Mutation Disease (?-/?-). People with this condition have mild to  severe anemia and symptoms similar to those seen in Hemoglobin H Disease. Given Eduardo's carrier screening result, carrier screening for her reproductive partner was offered to determine risk for the current pregnancy.    Testing/Screening Options:   Carrier Screening. Per the ACOG Committee Opinion 691, if an individual is found to be a carrier for a specific condition, the individual's reproductive partner should be offered testing in order to receive informed genetic counseling about potential reproductive outcomes. Genetic counseling offered Muhaned carrier screening for Alpha Thalassemia given Tina's carrier status.     Patient Plan:  Proceed with: Carrier screening for Muhaned for Alpha Thalassemia Informed consent was obtained. All questions were answered.    Addendum as of 06/26/22 at 3:58 pm. The following was copied from a telephone communication in the chart: Telephone call placed to Tamala Fothergill and her reproductive partner, Wayna Chalet, on 06/26/22 with the assistance of an Arabic interpreter ID [phone]. The following is a summary of what was discussed:   Frantasia and Muhaned attended genetic counseling on 06/10/22 to discuss Jessy's carrier screening results. 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 With this result, we know that Leahana has three working copies of the alpha-globin genes while her 4th alpha-globin gene is non-functional due to a point mutation. Given Leroy's carrier screening result, Muhaned was offered and accepted carrier screening for Alpha Thalassemia during the genetic counseling appointment. Muhaned's result indicates that he is also a silent carrier for alpha thalassemia (??/?-) caused by the pathogenic point mutation c.*94A>G. With this result, we know that Muhaned has three working copies of the alpha-globin genes while his 4th alpha-globin gene is non-functional due to a point  mutation. Given these results, there is a 25% risk for the current pregnancy to inherit both point mutations (one from each parent) and be affected with Homozygous Point Mutation Disease (?-/?-). A punnett square is included below:                    ??                    ?-              ??                  ??/?? Non-carrier               ??/?- Silent carrier of point mutation              ?-              ??/?- Silent carrier of point mutation                ?-/?- Affected with Homozygous Point Mutation Disease    People with Homozygous Point Mutation Disease have mild to severe anemia and symptoms similar to those seen in Hemoglobin H Disease. Clinical features of Hemoglobin H Disease are highly variable and generally develop in the first years of life. People with severe symptoms may have chronic anemia, liver disease, and bone changes. Some affected individuals do not require blood transfusions while others may require occasional blood transfusions throughout their lifetime.    Janaiyah has the option of continuing to the monitor the pregnancy via routine ultrasounds. However, pregnancies with Homozygous Point Mutation Disease are not expected to demonstrate features of  the condition on prenatal ultrasound. Thus, a normal-appearing ultrasound would not rule out the possibility of the baby being affected. If Madalyne opted to monitor with only ultrasound examination, her baby should have an evaluation by pediatric genetics after birth to discuss postnatal genetic testing. Amniocentesis is also available for prenatal diagnosis. This procedure involves the removal of a small amount of amniotic fluid from the sac surrounding the pregnancy with the use of a thin needle inserted through the maternal abdomen and uterus. Ultrasound guidance is used throughout the procedure. Possible procedural difficulties and complications that can arise include maternal infection, cramping, bleeding, fluid leakage,  and/or pregnancy loss. The risk for pregnancy loss with an amniocentesis is 1/500. After hearing the above information, Sibyl declined  amniocentesis for prenatal diagnosis.   In our conversation today, we additionally discussed that given Aamani and Muhaned's carrier screening results their three living children also have a 25% risk to be affected with Homozygous Point Mutation Disease. Leiah and Muhaned report that their three children are healthy and do not have any medical problems other than the occasional sickness (cold/flu). We discussed that they could be unaffected, or they could be affected but have very mild symptoms that to their knowledge have not caused noticeable medical problems thus far.   Finally, [place] and Muhaned denied consanguinity during their genetic counseling session on 06/10/22, however, today they report they could be second cousins. We briefly discussed that there is increased risk for birth defects and major medical conditions in the offspring of individuals who are related to each other, mostly attributed to autosomal recessive disorders. If the couple desired, the option of expanded carrier screening for more autosomal recessive conditions is available. Additionally, a microarray could be completed on an amniocentesis sample as a SNP microarray can help to target candidate regions of homozygosity and make possible a strategized approach to focused diagnostic testing for recessive genes.   Alecea and Muhaned verbalized understanding of all that was discussed on the telephone today. At the end of our conversation they did not have any additional questions and declined to come to the [facility] for additional post-test genetic counseling. Genetic counseling called the [facility] and spoke with Nehemiah Settle about these results ahead of Catlin's appointment on 06/27/22.     Thank you for sharing in the care of Tracey with [place].  Please do not  hesitate to contact us if you have any questions.  Teena Dunk, MS, [facility]

## 2022-06-26 ENCOUNTER — Telehealth: Payer: Self-pay | Admitting: Genetics

## 2022-06-26 NOTE — Telephone Encounter (Signed)
Telephone call placed to Valerie [name] and her reproductive partner, Valerie [name], on 06/26/22 with the assistance of an Arabic interpreter ID [phone]. The following is a summary of what was discussed:  Valerie [name] and Valerie [name] attended genetic counseling on 06/10/22 to discuss Valerie [name]'s carrier screening results. 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 With this result, we know that Valerie [name] has three working copies of the alpha-globin genes while her 4th alpha-globin gene is non-functional due to a point mutation. Given Valerie [name] carrier screening result, Valerie [name] was offered and accepted carrier screening for Alpha Thalassemia during the genetic counseling appointment. Valerie [name]'s result indicates that he is also a silent carrier for alpha thalassemia (??/?-) caused by the pathogenic point mutation c.*94A>G. With this result, we know that Valerie [name] has three working copies of the alpha-globin genes while his 4th alpha-globin gene is non-functional due to a point mutation. Given these results, there is a 25% risk for the current pregnancy to inherit both point mutations (one from each parent) and be affected with Homozygous Point Mutation Disease (?-/?-). A punnett square is included below:                   ??                    ?-              ??                  ??/?? Non-carrier               ??/?- Silent carrier of point mutation              ?-              ??/?- Silent carrier of point mutation                ?-/?- Affected with Homozygous Point Mutation Disease   People with Homozygous Point Mutation Disease have mild to severe anemia and symptoms similar to those seen in Hemoglobin H Disease. Clinical features of Hemoglobin H Disease are highly variable and generally develop in the first years of life. People with severe symptoms may have chronic anemia, liver disease, and bone changes. Some affected individuals do  not require blood transfusions while others may require occasional blood transfusions throughout their lifetime.   Valerie [name] has the option of continuing to the monitor the pregnancy via routine ultrasounds. However, pregnancies with Homozygous Point Mutation Disease are not expected to demonstrate features of the condition on prenatal ultrasound. Thus, a normal-appearing ultrasound would not rule out the possibility of the baby being affected. If Valerie [name] opted to monitor with only ultrasound examination, her baby should have an evaluation by pediatric genetics after birth to discuss postnatal genetic testing. Amniocentesis is also available for prenatal diagnosis. This procedure involves the removal of a small amount of amniotic fluid from the sac surrounding the pregnancy with the use of a thin needle inserted through the maternal abdomen and uterus. Ultrasound guidance is used throughout the procedure. Possible procedural difficulties and complications that can arise include maternal infection, cramping, bleeding, fluid leakage, and/or pregnancy loss. The risk for pregnancy loss with an amniocentesis is 1/500. After hearing the above information, Valerie [name] declined  amniocentesis for prenatal diagnosis.  In our conversation today, we additionally discussed that given Valerie [name] and Valerie [name]'s carrier screening results their three living children also  have a 25% risk to be affected with Homozygous Point Mutation Disease. Valerie [name] and Valerie [name] report that their three children are healthy and do not have any medical problems other than the occasional sickness (cold/flu). We discussed that they could be unaffected, or they could be affected but have very mild symptoms that to their knowledge have not caused noticeable medical problems thus far.  Finally, Valerie [name] and Valerie [name] denied consanguinity during their genetic counseling session on 06/10/22, however, today they report they could be second cousins. We briefly discussed that  there is increased risk for birth defects and major medical conditions in the offspring of individuals who are related to each other, mostly attributed to autosomal recessive disorders. If the couple desired, the option of expanded carrier screening for more autosomal recessive conditions is available. Additionally, a microarray could be completed on an amniocentesis sample as a SNP microarray can help to target candidate regions of homozygosity and make possible a strategized approach to focused diagnostic testing for recessive genes.  Valerie [name] and Valerie [name] verbalized understanding of all that was discussed on the telephone today. At the end of our conversation they did not have any additional questions and declined to come to the [facility] for additional post-test genetic counseling. Genetic counseling called the [facility] and spoke with Valerie [name] about these results ahead of Valerie [name]'s appointment on 06/27/22.

## 2022-10-18 ENCOUNTER — Encounter (HOSPITAL_COMMUNITY): Payer: Self-pay

## 2022-10-18 ENCOUNTER — Other Ambulatory Visit: Payer: Self-pay

## 2022-10-18 ENCOUNTER — Inpatient Hospital Stay (HOSPITAL_COMMUNITY): Payer: Medicaid Other | Admitting: Anesthesiology

## 2022-10-18 ENCOUNTER — Encounter (HOSPITAL_COMMUNITY)
Admission: AD | Disposition: A | Discharge status: Home / Self Care | Payer: Self-pay | Source: Home / Self Care | Attending: Obstetrics and Gynecology

## 2022-10-18 ENCOUNTER — Inpatient Hospital Stay (HOSPITAL_COMMUNITY)
Admission: AD | Admit: 2022-10-18 | Discharge: 2022-10-19 | DRG: 788 | Disposition: A | Discharge status: Home / Self Care | Payer: Medicaid Other | Attending: Obstetrics and Gynecology | Admitting: Obstetrics and Gynecology

## 2022-10-18 DIAGNOSIS — O479 False labor, unspecified: Secondary | ICD-10-CM | POA: Diagnosis not present

## 2022-10-18 DIAGNOSIS — O9902 Anemia complicating childbirth: Secondary | ICD-10-CM | POA: Diagnosis present

## 2022-10-18 DIAGNOSIS — O328XX0 Maternal care for other malpresentation of fetus, not applicable or unspecified: Principal | ICD-10-CM | POA: Diagnosis present

## 2022-10-18 DIAGNOSIS — Z3490 Encounter for supervision of normal pregnancy, unspecified, unspecified trimester: Secondary | ICD-10-CM

## 2022-10-18 DIAGNOSIS — O321XX0 Maternal care for breech presentation, not applicable or unspecified: Secondary | ICD-10-CM

## 2022-10-18 DIAGNOSIS — Z148 Genetic carrier of other disease: Secondary | ICD-10-CM

## 2022-10-18 DIAGNOSIS — Z3A39 39 weeks gestation of pregnancy: Secondary | ICD-10-CM | POA: Diagnosis not present

## 2022-10-18 DIAGNOSIS — Z98891 History of uterine scar from previous surgery: Secondary | ICD-10-CM

## 2022-10-18 DIAGNOSIS — O329XX0 Maternal care for malpresentation of fetus, unspecified, not applicable or unspecified: Secondary | ICD-10-CM | POA: Diagnosis not present

## 2022-10-18 DIAGNOSIS — O26893 Other specified pregnancy related conditions, third trimester: Secondary | ICD-10-CM | POA: Diagnosis present

## 2022-10-18 LAB — CBC
HCT: 32.9 % — ABNORMAL LOW (ref 36.0–46.0)
Hemoglobin: 11 g/dL — ABNORMAL LOW (ref 12.0–15.0)
MCH: 26.9 pg (ref 26.0–34.0)
MCHC: 33.4 g/dL (ref 30.0–36.0)
MCV: 80.4 fL (ref 80.0–100.0)
Platelets: 165 10*3/uL (ref 150–400)
RBC: 4.09 MIL/uL (ref 3.87–5.11)
RDW: 13.1 % (ref 11.5–15.5)
WBC: 8.3 10*3/uL (ref 4.0–10.5)
nRBC: 0 % (ref 0.0–0.2)

## 2022-10-18 LAB — CBC WITH DIFFERENTIAL/PLATELET
Abs Immature Granulocytes: 0.05 10*3/uL (ref 0.00–0.07)
Basophils Absolute: 0 10*3/uL (ref 0.0–0.1)
Basophils Relative: 0 %
Eosinophils Absolute: 0 10*3/uL (ref 0.0–0.5)
Eosinophils Relative: 0 %
HCT: 31.5 % — ABNORMAL LOW (ref 36.0–46.0)
Hemoglobin: 10.5 g/dL — ABNORMAL LOW (ref 12.0–15.0)
Immature Granulocytes: 1 %
Lymphocytes Relative: 14 %
Lymphs Abs: 1.5 10*3/uL (ref 0.7–4.0)
MCH: 26.6 pg (ref 26.0–34.0)
MCHC: 33.3 g/dL (ref 30.0–36.0)
MCV: 79.7 fL — ABNORMAL LOW (ref 80.0–100.0)
Monocytes Absolute: 0.7 10*3/uL (ref 0.1–1.0)
Monocytes Relative: 7 %
Neutro Abs: 8.6 10*3/uL — ABNORMAL HIGH (ref 1.7–7.7)
Neutrophils Relative %: 78 %
Platelets: 168 10*3/uL (ref 150–400)
RBC: 3.95 MIL/uL (ref 3.87–5.11)
RDW: 13 % (ref 11.5–15.5)
WBC: 10.9 10*3/uL — ABNORMAL HIGH (ref 4.0–10.5)
nRBC: 0 % (ref 0.0–0.2)

## 2022-10-18 LAB — TYPE AND SCREEN
ABO/RH(D): A POS
Antibody Screen: NEGATIVE

## 2022-10-18 LAB — RPR: RPR Ser Ql: NONREACTIVE

## 2022-10-18 LAB — HIV ANTIBODY (ROUTINE TESTING W REFLEX): HIV Screen 4th Generation wRfx: NONREACTIVE

## 2022-10-18 SURGERY — Surgical Case
Anesthesia: Spinal | Site: Abdomen

## 2022-10-18 MED ORDER — TETANUS-DIPHTH-ACELL PERTUSSIS 5-2.5-18.5 LF-MCG/0.5 IM SUSY: 0.5000 mL | PREFILLED_SYRINGE | Freq: Once | INTRAMUSCULAR | Status: DC

## 2022-10-18 MED ORDER — DEXAMETHASONE SODIUM PHOSPHATE 4 MG/ML IJ SOLN
INTRAMUSCULAR | Status: AC
  Filled 2022-10-18: qty 1

## 2022-10-18 MED ORDER — SENNOSIDES-DOCUSATE SODIUM 8.6-50 MG PO TABS
2.0000 | ORAL_TABLET | Freq: Every day | ORAL | Status: DC
  Administered 2022-10-19: 2 via ORAL
  Filled 2022-10-18: qty 2

## 2022-10-18 MED ORDER — SOD CITRATE-CITRIC ACID 500-334 MG/5ML PO SOLN
30.0000 mL | Freq: Once | ORAL | Status: AC
  Administered 2022-10-18: 30 mL via ORAL
  Filled 2022-10-18: qty 30

## 2022-10-18 MED ORDER — ACETAMINOPHEN 500 MG PO TABS
1000.0000 mg | ORAL_TABLET | Freq: Four times a day (QID) | ORAL | Status: DC
  Administered 2022-10-18 – 2022-10-19 (×4): 1000 mg via ORAL
  Filled 2022-10-18 (×5): qty 2

## 2022-10-18 MED ORDER — IBUPROFEN 600 MG PO TABS
600.0000 mg | ORAL_TABLET | Freq: Four times a day (QID) | ORAL | Status: DC
  Administered 2022-10-19: 600 mg via ORAL
  Filled 2022-10-18: qty 1

## 2022-10-18 MED ORDER — GABAPENTIN 100 MG PO CAPS
100.0000 mg | ORAL_CAPSULE | Freq: Two times a day (BID) | ORAL | Status: DC
  Administered 2022-10-18 – 2022-10-19 (×3): 100 mg via ORAL
  Filled 2022-10-18 (×5): qty 1

## 2022-10-18 MED ORDER — OXYTOCIN-SODIUM CHLORIDE 30-0.9 UT/500ML-% IV SOLN
INTRAVENOUS | Status: AC
  Filled 2022-10-18: qty 500

## 2022-10-18 MED ORDER — OXYTOCIN-SODIUM CHLORIDE 30-0.9 UT/500ML-% IV SOLN
2.5000 [IU]/h | INTRAVENOUS | Status: DC
  Administered 2022-10-18: 2.5 [IU]/h via INTRAVENOUS
  Filled 2022-10-18: qty 500

## 2022-10-18 MED ORDER — ZOLPIDEM TARTRATE 5 MG PO TABS: 5.0000 mg | ORAL_TABLET | Freq: Every evening | ORAL | Status: DC | PRN

## 2022-10-18 MED ORDER — LACTATED RINGERS IV BOLUS
1000.0000 mL | Freq: Once | INTRAVENOUS | Status: AC
  Administered 2022-10-18: 1000 mL via INTRAVENOUS

## 2022-10-18 MED ORDER — ACETAMINOPHEN 10 MG/ML IV SOLN
INTRAVENOUS | Status: AC
  Filled 2022-10-18: qty 100

## 2022-10-18 MED ORDER — FENTANYL CITRATE (PF) 100 MCG/2ML IJ SOLN
INTRAMUSCULAR | Status: AC
  Filled 2022-10-18: qty 2

## 2022-10-18 MED ORDER — MENTHOL 3 MG MT LOZG: 1.0000 | LOZENGE | OROMUCOSAL | Status: DC | PRN

## 2022-10-18 MED ORDER — ONDANSETRON HCL 4 MG/2ML IJ SOLN
INTRAMUSCULAR | Status: DC | PRN
  Administered 2022-10-18: 4 mg via INTRAVENOUS

## 2022-10-18 MED ORDER — DIPHENHYDRAMINE HCL 25 MG PO CAPS: 25.0000 mg | ORAL_CAPSULE | Freq: Four times a day (QID) | ORAL | Status: DC | PRN

## 2022-10-18 MED ORDER — SODIUM CHLORIDE 0.9 % IR SOLN
Status: DC | PRN
  Administered 2022-10-18: 1

## 2022-10-18 MED ORDER — SIMETHICONE 80 MG PO CHEW: 80.0000 mg | CHEWABLE_TABLET | ORAL | Status: DC | PRN

## 2022-10-18 MED ORDER — FENTANYL CITRATE (PF) 100 MCG/2ML IJ SOLN
INTRAMUSCULAR | Status: DC | PRN
  Administered 2022-10-18: 15 ug via INTRATHECAL

## 2022-10-18 MED ORDER — LACTATED RINGERS IV SOLN: INTRAVENOUS | Status: DC

## 2022-10-18 MED ORDER — FENTANYL CITRATE (PF) 100 MCG/2ML IJ SOLN
50.0000 ug | Freq: Once | INTRAMUSCULAR | Status: AC
  Administered 2022-10-18: 50 ug via INTRAVENOUS
  Filled 2022-10-18: qty 2

## 2022-10-18 MED ORDER — PRENATAL MULTIVITAMIN CH
1.0000 | ORAL_TABLET | Freq: Every day | ORAL | Status: DC
  Administered 2022-10-19: 1 via ORAL
  Filled 2022-10-18 (×2): qty 1

## 2022-10-18 MED ORDER — STERILE WATER FOR IRRIGATION IR SOLN
Status: DC | PRN
  Administered 2022-10-18: 1000 mL

## 2022-10-18 MED ORDER — DIPHENHYDRAMINE HCL 50 MG/ML IJ SOLN
INTRAMUSCULAR | Status: DC | PRN
  Administered 2022-10-18: 25 mg via INTRAVENOUS

## 2022-10-18 MED ORDER — SODIUM CHLORIDE 0.9 % IV SOLN: INTRAVENOUS | Status: DC | PRN

## 2022-10-18 MED ORDER — PHENYLEPHRINE HCL-NACL 20-0.9 MG/250ML-% IV SOLN
INTRAVENOUS | Status: DC | PRN
  Administered 2022-10-18: 60 ug/min via INTRAVENOUS

## 2022-10-18 MED ORDER — WITCH HAZEL-GLYCERIN EX PADS: 1.0000 | MEDICATED_PAD | CUTANEOUS | Status: DC | PRN

## 2022-10-18 MED ORDER — BUPIVACAINE IN DEXTROSE 0.75-8.25 % IT SOLN
INTRATHECAL | Status: DC | PRN
  Administered 2022-10-18: 1.6 mL via INTRATHECAL

## 2022-10-18 MED ORDER — DEXMEDETOMIDINE HCL IN NACL 200 MCG/50ML IV SOLN
INTRAVENOUS | Status: DC | PRN
  Administered 2022-10-18: 8 ug via INTRAVENOUS

## 2022-10-18 MED ORDER — MORPHINE SULFATE (PF) 0.5 MG/ML IJ SOLN
INTRAMUSCULAR | Status: AC
  Filled 2022-10-18: qty 10

## 2022-10-18 MED ORDER — COCONUT OIL OIL: 1.0000 | TOPICAL_OIL | Status: DC | PRN

## 2022-10-18 MED ORDER — EPHEDRINE SULFATE (PRESSORS) 50 MG/ML IJ SOLN
INTRAMUSCULAR | Status: DC | PRN
  Administered 2022-10-18: 5 mg via INTRAVENOUS

## 2022-10-18 MED ORDER — DIBUCAINE (PERIANAL) 1 % EX OINT: 1.0000 | TOPICAL_OINTMENT | CUTANEOUS | Status: DC | PRN

## 2022-10-18 MED ORDER — KETOROLAC TROMETHAMINE 30 MG/ML IJ SOLN
30.0000 mg | Freq: Four times a day (QID) | INTRAMUSCULAR | Status: AC
  Administered 2022-10-18 – 2022-10-19 (×4): 30 mg via INTRAVENOUS
  Filled 2022-10-18 (×4): qty 1

## 2022-10-18 MED ORDER — ENOXAPARIN SODIUM 40 MG/0.4ML IJ SOSY
40.0000 mg | PREFILLED_SYRINGE | INTRAMUSCULAR | Status: DC
  Administered 2022-10-19: 40 mg via SUBCUTANEOUS
  Filled 2022-10-18: qty 0.4

## 2022-10-18 MED ORDER — CEFAZOLIN SODIUM-DEXTROSE 2-4 GM/100ML-% IV SOLN
2.0000 g | INTRAVENOUS | Status: AC
  Administered 2022-10-18: 2 g via INTRAVENOUS
  Filled 2022-10-18: qty 100

## 2022-10-18 MED ORDER — OXYCODONE HCL 5 MG PO TABS: 5.0000 mg | ORAL_TABLET | ORAL | Status: DC | PRN

## 2022-10-18 MED ORDER — SCOPOLAMINE 1 MG/3DAYS TD PT72
MEDICATED_PATCH | TRANSDERMAL | Status: AC
  Filled 2022-10-18: qty 1

## 2022-10-18 MED ORDER — ONDANSETRON HCL 4 MG/2ML IJ SOLN
INTRAMUSCULAR | Status: AC
  Filled 2022-10-18: qty 2

## 2022-10-18 MED ORDER — DEXAMETHASONE SODIUM PHOSPHATE 4 MG/ML IJ SOLN
INTRAMUSCULAR | Status: DC | PRN
  Administered 2022-10-18: 4 mg via INTRAVENOUS

## 2022-10-18 MED ORDER — ACETAMINOPHEN 10 MG/ML IV SOLN
INTRAVENOUS | Status: DC | PRN
  Administered 2022-10-18: 1000 mg via INTRAVENOUS

## 2022-10-18 MED ORDER — SIMETHICONE 80 MG PO CHEW
80.0000 mg | CHEWABLE_TABLET | Freq: Three times a day (TID) | ORAL | Status: DC
  Administered 2022-10-18 – 2022-10-19 (×2): 80 mg via ORAL
  Filled 2022-10-18 (×5): qty 1

## 2022-10-18 MED ORDER — MORPHINE SULFATE (PF) 0.5 MG/ML IJ SOLN
INTRAMUSCULAR | Status: DC | PRN
  Administered 2022-10-18: 150 ug via INTRATHECAL

## 2022-10-18 MED ORDER — DIPHENHYDRAMINE HCL 50 MG/ML IJ SOLN
INTRAMUSCULAR | Status: AC
  Filled 2022-10-18: qty 1

## 2022-10-18 MED ORDER — OXYTOCIN-SODIUM CHLORIDE 30-0.9 UT/500ML-% IV SOLN
INTRAVENOUS | Status: DC | PRN
  Administered 2022-10-18: 30 [IU] via INTRAVENOUS

## 2022-10-18 MED ORDER — POVIDONE-IODINE 10 % EX SWAB
2.0000 | Freq: Once | CUTANEOUS | Status: AC
  Administered 2022-10-18: 2 via TOPICAL

## 2022-10-18 MED ORDER — PHENYLEPHRINE HCL-NACL 20-0.9 MG/250ML-% IV SOLN
INTRAVENOUS | Status: AC
  Filled 2022-10-18: qty 250

## 2022-10-18 SURGICAL SUPPLY — 34 items
ADH SKN CLS APL DERMABOND .7 (GAUZE/BANDAGES/DRESSINGS) ×1
APL PRP STRL LF DISP 70% ISPRP (MISCELLANEOUS) ×2
CHLORAPREP W/TINT 26 (MISCELLANEOUS) ×2 IMPLANT
CLAMP UMBILICAL CORD (MISCELLANEOUS) ×1 IMPLANT
CLOTH BEACON ORANGE TIMEOUT ST (SAFETY) ×1 IMPLANT
DERMABOND ADVANCED .7 DNX12 (GAUZE/BANDAGES/DRESSINGS) ×1 IMPLANT
DRSG OPSITE POSTOP 4X10 (GAUZE/BANDAGES/DRESSINGS) ×1 IMPLANT
ELECT REM PT RETURN 9FT ADLT (ELECTROSURGICAL) ×1
ELECTRODE REM PT RTRN 9FT ADLT (ELECTROSURGICAL) ×1 IMPLANT
GLOVE BIOGEL PI IND STRL 7.0 (GLOVE) ×3 IMPLANT
GLOVE ECLIPSE 6.5 STRL STRAW (GLOVE) ×1 IMPLANT
GOWN STRL REUS W/TWL LRG LVL3 (GOWN DISPOSABLE) ×3 IMPLANT
KIT ABG SYR 3ML LUER SLIP (SYRINGE) IMPLANT
NDL HYPO 25X5/8 SAFETYGLIDE (NEEDLE) IMPLANT
NEEDLE HYPO 25X5/8 SAFETYGLIDE (NEEDLE) IMPLANT
NS IRRIG 1000ML POUR BTL (IV SOLUTION) ×1 IMPLANT
PACK C SECTION WH (CUSTOM PROCEDURE TRAY) ×1 IMPLANT
PAD ABD 7.5X8 STRL (GAUZE/BANDAGES/DRESSINGS) ×1 IMPLANT
PAD OB MATERNITY 4.3X12.25 (PERSONAL CARE ITEMS) ×1 IMPLANT
RTRCTR C-SECT PINK 25CM LRG (MISCELLANEOUS) ×1 IMPLANT
SUT PLAIN 0 NONE (SUTURE) IMPLANT
SUT PLAIN 2 0 XLH (SUTURE) IMPLANT
SUT VIC AB 0 CT1 27 (SUTURE) ×2
SUT VIC AB 0 CT1 27XBRD ANBCTR (SUTURE) ×2 IMPLANT
SUT VIC AB 0 CTX 36 (SUTURE) ×3
SUT VIC AB 0 CTX36XBRD ANBCTRL (SUTURE) ×3 IMPLANT
SUT VIC AB 2-0 CT1 27 (SUTURE) ×1
SUT VIC AB 2-0 CT1 TAPERPNT 27 (SUTURE) ×1 IMPLANT
SUT VIC AB 3-0 SH 27 (SUTURE) ×1
SUT VIC AB 3-0 SH 27XBRD (SUTURE) ×1 IMPLANT
SUT VIC AB 4-0 KS 27 (SUTURE) ×1 IMPLANT
TOWEL OR 17X24 6PK STRL BLUE (TOWEL DISPOSABLE) ×1 IMPLANT
TRAY FOLEY W/BAG SLVR 14FR LF (SET/KITS/TRAYS/PACK) IMPLANT
WATER STERILE IRR 1000ML POUR (IV SOLUTION) ×1 IMPLANT

## 2022-10-18 NOTE — Anesthesia Preprocedure Evaluation (Addendum)
Anesthesia Evaluation  Patient identified by MRN, date of birth, ID band Patient awake    Reviewed: Allergy & Precautions, NPO status , Patient's Chart, lab work & pertinent test results  History of Anesthesia Complications Negative for: history of anesthetic complications  Airway Mallampati: III  TM Distance: >3 FB Neck ROM: Full    Dental  (+) Teeth Intact   Pulmonary neg pulmonary ROS   Pulmonary exam normal breath sounds clear to auscultation       Cardiovascular negative cardio ROS  Rhythm:Regular Rate:Normal     Neuro/Psych negative neurological ROS     GI/Hepatic negative GI ROS, Neg liver ROS,,,  Endo/Other  negative endocrine ROS    Renal/GU negative Renal ROS     Musculoskeletal   Abdominal   Peds  Hematology negative hematology ROS (+)   Anesthesia Other Findings   Reproductive/Obstetrics (+) Pregnancy                             Anesthesia Physical Anesthesia Plan  ASA: 2  Anesthesia Plan: Spinal   Post-op Pain Management:    Induction:   PONV Risk Score and Plan:   Airway Management Planned:   Additional Equipment:   Intra-op Plan:   Post-operative Plan:   Informed Consent: I have reviewed the patients History and Physical, chart, labs and discussed the procedure including the risks, benefits and alternatives for the proposed anesthesia with the patient or authorized representative who has indicated his/her understanding and acceptance.       Plan Discussed with: CRNA and Anesthesiologist  Anesthesia Plan Comments: (I have discussed risks of neuraxial anesthesia including but not limited to infection, bleeding, nerve injury, back pain, headache, seizures, and failure of block. Patient denies bleeding disorders and is not currently anticoagulated. Labs have been reviewed. Risks and benefits discussed. All patient's questions answered.  )        Anesthesia Quick Evaluation

## 2022-10-18 NOTE — Transfer of Care (Signed)
Immediate Anesthesia Transfer of Care Note  Patient: Valerie [name]  Procedure(s) Performed: CESAREAN SECTION (Abdomen)  Patient Location: PACU  Anesthesia Type:Spinal  Level of Consciousness: awake  Airway & Oxygen Therapy: Patient Spontanous Breathing  Post-op Assessment: Report given to RN  Post vital signs: Reviewed and stable  Last Vitals:  Vitals Value Taken Time  BP    Temp    Pulse    Resp    SpO2      Last Pain:  Vitals:   10/18/22 0317  TempSrc: Oral  PainSc: 10-Worst pain ever         Complications: No notable events documented.

## 2022-10-18 NOTE — MAU Note (Signed)
.  Valerie [name] is a 33 y.o. at [redacted]w[redacted]d here in MAU reporting: ctx started around 0030 - slowed down, but picked back up around 0200 and have been every few minutes. Denies VB or LOF. +FM  Onset of complaint: 0030 Pain score: 10 Vitals:   10/18/22 0317  BP: 124/65  Pulse: 98  Resp: (!) 22  Temp: 98.3 F (36.8 C)

## 2022-10-18 NOTE — Op Note (Addendum)
[facility] A Supple PROCEDURE DATE: 10/18/2022  PREOPERATIVE DIAGNOSES: Intrauterine pregnancy at [redacted]w[redacted]d weeks gestation; malpresentation: breech  POSTOPERATIVE DIAGNOSES: The same  PROCEDURE: Low Transverse Cesarean Section  SURGEON:  Dr. Nelda Marseille  ASSISTANT:  Dr. Janus Molder. An experienced assistant was required given the standard of surgical care given the complexity of the case.  This assistant was needed for exposure, dissection, suctioning, retraction, instrument exchange, assisting with delivery with administration of fundal pressure, and for overall help during the procedure.  ANESTHESIOLOGY TEAM: Anesthesiologist: Nilda Simmer, MD CRNA: Ignacia Bayley, CRNA; Sterling, Sheron Nightingale, CRNA  INDICATIONS: Valerie [name] is a 32 y.o. [phone] at [redacted]w[redacted]d here for cesarean section secondary to the indications listed under preoperative diagnoses; please see preoperative note for further details.  The risks of surgery were discussed with the patient including but were not limited to: bleeding which may require transfusion or reoperation; infection which may require antibiotics; injury to bowel, bladder, ureters or other surrounding organs; injury to the fetus; need for additional procedures including hysterectomy in the event of a life-threatening hemorrhage; formation of adhesions; placental abnormalities wth subsequent pregnancies; incisional problems; thromboembolic phenomenon and other postoperative/anesthesia complications.  The patient concurred with the proposed plan, giving informed written consent for the procedure.    FINDINGS:  Viable female infant in footling breech presentation.  Apgars 9 and 9. Meconium stained amniotic fluid.  Intact placenta, three vessel cord.  Normal uterus, fallopian tubes and ovaries bilaterally.  ANESTHESIA: Spinal INTRAVENOUS FLUIDS: 1000 ml   ESTIMATED BLOOD LOSS: 669 ml URINE OUTPUT:  150 ml SPECIMENS: Placenta sent to L&D COMPLICATIONS: None  immediate  PROCEDURE IN DETAIL:  The patient preoperatively received intravenous antibiotics and had sequential compression devices applied to her lower extremities.  She was then taken to the operating room where spinal anesthesia was administered and was found to be adequate. She was then placed in a dorsal supine position with a leftward tilt, and prepped and draped in a sterile manner.  A foley catheter was placed into her bladder and attached to constant gravity.  After an adequate timeout was performed, a Pfannenstiel skin incision was made with scalpel and carried through to the underlying layer of fascia. The fascia was incised in the midline, and this incision was extended bilaterally in a blunt fashion.  The underlying rectus muscles were dissected off the fascia superiorly and inferiorly in a blunt fashion. The rectus muscles were separated in the midline and the peritoneum was entered bluntly. The Alexis self-retaining retractor was introduced into the abdominal cavity.  Attention was turned to the lower uterine segment where a low transverse hysterotomy was made with a scalpel and extended bilaterally bluntly.  The infant was successfully delivered from footling breech presentation, the cord was clamped and cut after one minute, and the infant was handed over to the awaiting neonatology team. Uterine massage was then administered, and the placenta delivered intact with a three-vessel cord. The uterus was then cleared of clots and debris.  The hysterotomy was closed with 0 Vicryl in a running fashion. The pelvis was cleared of all clot and debris. Hemostasis was confirmed on all surfaces.  The retractor was removed.  The peritoneum was closed with a 0 Vicryl running stitch. The fascia was then closed using 0 Vicryl in a running fashion.  The subcutaneous layer was irrigated, reapproximated with 2-0 plain gut running stitches, and the skin was closed with a 4-0 Vicryl subcuticular stitch. The patient  tolerated the procedure well. Sponge, instrument  and needle counts were correct x 3.  She was taken to the recovery room in stable condition.   Gerlene Fee, DO OB Fellow, [place] [facility] 10/18/2022, 7:03 AM

## 2022-10-18 NOTE — Discharge Summary (Signed)
Postpartum Discharge Summary   Patient adamantly desires discharge home on POD#1 due to other children being at home. CNM consulted with Dr. Kennon Rounds who states it is ok to discharge home with stable hemoglobin and conversation about expected pain on Day 2. CNM discussed likely increase in pain and management at home. Discussed when to return to MAU at length. Patient grateful for d/c home.      Patient Name: Valerie [name] DOB: December 13, 1990 MRN: [id number]  Date of admission: 10/18/2022 Delivery date:10/18/2022  Delivering provider: Janyth Pupa  Date of discharge: 10/19/2022  Admitting diagnosis: Intrauterine normal pregnancy [Z34.90] S/P primary low transverse C-section [Z98.891] Intrauterine pregnancy: [redacted]w[redacted]d     Secondary diagnosis:  Principal Problem:   Intrauterine normal pregnancy Active Problems:   S/P primary low transverse C-section  Additional problems: n/a    Discharge diagnosis: Term Pregnancy Delivered                                              Post partum procedures: n/a Augmentation: N/A Complications: None  Hospital course: Onset of Labor With Unplanned C/S   32 y.o. yo [id number] at [redacted]w[redacted]d was admitted in Latent Labor on 10/18/2022. Patient had a labor course significant for meconium stained amniotic fluid. The patient went for cesarean section due to Malpresentation. Delivery details as follows: Membrane Rupture Time/Date:  ,   Delivery Method:C-Section, Low Transverse  Details of operation can be found in separate operative note. Patient had a postpartum course complicated byn/a.  She is ambulating,tolerating a regular diet, passing flatus, and urinating well.  Patient is discharged home in stable condition 10/19/22.  Newborn Data: Birth date:10/18/2022  Birth time:6:27 AM  Gender:Female  Living status:Living  Apgars:9 ,9  Weight:3260 g   Magnesium Sulfate received: No BMZ received: No Rhophylac:No MMR:N/A T-DaP: n/a Flu: N/A Transfusion:No  Physical  exam  Vitals:   10/18/22 2124 10/19/22 0009 10/19/22 0532 10/19/22 0826  BP:  105/65 (!) 88/50 (!) 96/51  Pulse:  67 60 71  Resp: 18 18 18 20   Temp: 97.8 F (36.6 C) 98 F (36.7 C) 97.8 F (36.6 C)   TempSrc: Oral Oral Oral   SpO2:  99% 99% 100%  Weight:      Height:       General: alert, cooperative, and no distress Lochia: appropriate Uterine Fundus: firm Incision: Healing well with no significant drainage, No significant erythema, Dressing is clean, dry, and intact DVT Evaluation: No evidence of DVT seen on physical exam. Labs: Lab Results  Component Value Date   WBC 9.6 10/19/2022   HGB 10.1 (L) 10/19/2022   HCT 31.4 (L) 10/19/2022   MCV 82.2 10/19/2022   PLT 166 10/19/2022      Latest Ref Rng & Units 01/26/2021    3:39 PM  CMP  Glucose 70 - 99 mg/dL 96   BUN 6 - 20 mg/dL 10   Creatinine 0.44 - 1.00 mg/dL 0.50   Sodium 135 - 145 mmol/L 139   Potassium 3.5 - 5.1 mmol/L 3.4   Chloride 98 - 111 mmol/L 110    Edinburgh Score:     No data to display           After visit meds:  Allergies as of 10/19/2022   No Known Allergies      Medication List     TAKE these  medications    ibuprofen 600 MG tablet Commonly known as: ADVIL Take 1 tablet (600 mg total) by mouth every 6 (six) hours.   oxyCODONE-acetaminophen 5-325 MG tablet Commonly known as: Percocet Take 1 tablet by mouth every 4 (four) hours as needed for severe pain.   senna-docusate 8.6-50 MG tablet Commonly known as: Senokot-S Take 2 tablets by mouth daily.         Discharge home in stable condition Infant Feeding: Bottle and Breast Infant Disposition:home with mother Discharge instruction: per After Visit Summary and Postpartum booklet. Activity: Advance as tolerated. Pelvic rest for 6 weeks.  Diet: routine diet Future Appointments:No future appointments. Follow up Visit:  [place] [facility] [place] at [facility] Follow up.    Specialty: Obstetrics and Gynecology Why: In 1 week for an incision check Contact information: [address] [place] [address] [phone]                [email or web address]   Please schedule this patient for a In person postpartum visit in 6 weeks with the following provider: MD and APP. Additional Postpartum F/U:Incision check 1 week  Low risk pregnancy complicated by:  alpha thal silent carrier Delivery mode:  C-Section, Low Transverse  Anticipated Birth Control:  Unsure  Message sent to [facility] for 1 week incision check.  10/19/2022 Wende Mott, CNM

## 2022-10-18 NOTE — H&P (Signed)
Valerie [name] is a 32 y.o. female presenting for painful uterine contractions since 0030, stronger since 0200.  Pregnancy has been followed at [facility] and uncomplicated except for being an alpha thalassemia carrier.   . OB History     Gravida  4   Para  3   Term  3   Preterm  0   AB  0   Living  3      SAB  0   IAB  0   Ectopic  0   Multiple  0   Live Births  2          Past Medical History:  Diagnosis Date   Medical history non-contributory    No pertinent past medical history    Past Surgical History:  Procedure Laterality Date   APPENDECTOMY     LAPAROSCOPIC APPENDECTOMY  08/21/2012   Procedure: APPENDECTOMY LAPAROSCOPIC;  Surgeon: Edward Jolly, MD;  Location: [facility];  Service: General;  Laterality: N/A;   Family History: family history is not on file. Social History:  reports that she has never smoked. She has never used smokeless tobacco. She reports that she does not drink alcohol and does not use drugs.     Maternal Diabetes: No Genetic Screening: Normal Maternal Ultrasounds/Referrals: Normal Fetal Ultrasounds or other Referrals:  None Maternal Substance Abuse:  No Significant Maternal Medications:  None Significant Maternal Lab Results:  Group B Strep negative Number of Prenatal Visits:greater than 3 verified prenatal visits Other Comments:  None  Review of Systems  Constitutional:  Negative for chills and fever.  Eyes:  Negative for visual disturbance.  Respiratory:  Negative for shortness of breath.   Gastrointestinal:  Positive for abdominal pain. Negative for constipation, diarrhea, nausea and vomiting.  Genitourinary:  Positive for pelvic pain. Negative for vaginal bleeding.   Maternal Medical History:  Reason for admission: Contractions.  Nausea.  Contractions: Onset was 1-2 hours ago.   Frequency: regular.   Perceived severity is strong.   Fetal activity: Perceived fetal activity is normal.   Last perceived fetal movement  was within the past hour.   Prenatal complications: No bleeding, PIH, oligohydramnios, placental abnormality, pre-eclampsia or preterm labor.   Alpha thalassemia carrier  Prenatal Complications - Diabetes: none.   Dilation: 3.5 Effacement (%): 50 Station: [employer] Exam by:: [employer], RN Blood pressure 124/65, pulse 98, temperature 98.3 F (36.8 C), temperature source Oral, resp. rate (!) 22, height 5\' 4"  (1.626 m), weight 66 kg. Maternal Exam:  Uterine Assessment: Contraction strength is firm.  Contraction frequency is regular.  Abdomen: Patient reports no abdominal tenderness. Fetal presentation: breech Introitus: Normal vulva. Normal vagina.  Pelvis: questionable for delivery.   Cervix: Cervix evaluated by digital exam.     Fetal Exam Fetal Monitor Review: Mode: ultrasound.   Baseline rate: 140.  Variability: moderate (6-25 bpm).   Pattern: accelerations present and no decelerations.   Fetal State Assessment: Category I - tracings are normal.   Physical Exam Constitutional:      General: She is not in acute distress.    Appearance: She is not ill-appearing, toxic-appearing or diaphoretic.     Comments: Uncomfortable with labor pain   HENT:     Head: Normocephalic.  Cardiovascular:     Rate and Rhythm: Normal rate.  Pulmonary:     Effort: Pulmonary effort is normal.  Abdominal:     General: There is no distension.     Palpations: There is no mass.  Tenderness: There is no abdominal tenderness. There is no guarding or rebound.  Genitourinary:    General: Normal vulva.     Comments: Dilation: 3.5 Effacement (%): 50 Station: [employer] Presentation: Undeterminable Exam by:: [employer], RN  Musculoskeletal:     Cervical back: Normal range of motion.  Skin:    General: Skin is warm and dry.  Neurological:     General: No focal deficit present.     Mental Status: She is alert.  Psychiatric:        Mood and Affect: Mood normal.      Prenatal labs: ABO, Rh:   Antibody:   Rubella:   RPR:    HBsAg:    HIV:    GBS:     Assessment/Plan: Single IUP at [redacted]w[redacted]d Active Labor  Breech presentation with bulging membranes  Admit to OR Dr Nelda Marseille at bedside to examine patient and consent her for Cesarean Delivery MD to follow   Hansel Feinstein 10/18/2022, 4:56 AM

## 2022-10-18 NOTE — Progress Notes (Signed)
Anesthesia MD at bedside.

## 2022-10-18 NOTE — MAU Provider Note (Signed)
Pt informed that the ultrasound is considered a limited OB ultrasound and is not intended to be a complete ultrasound exam.  Patient also informed that the ultrasound is not being completed with the intent of assessing for fetal or placental anomalies or any pelvic abnormalities.  Explained that the purpose of today's ultrasound is to assess for presentation.  Patient acknowledges the purpose of the exam and the limitations of the study.    Breech presentation Head in fundus, slightly to LUQ

## 2022-10-18 NOTE — Progress Notes (Addendum)
Post Partum Day 0  Subjective: Saw patient given concerns relayed from nursing staff. Pt was reported to be pale and fatigued. Upon seeing patient, she reports feeling tired. She denied any SOB, dizziness, or headache. Pt has not ambulated today thus far.   Pt would also like to only be seen by female providers.  Objective: Blood pressure (!) 101/53, pulse 68, temperature 98.6 F (37 C), temperature source Oral, resp. rate 16, height 5\' 4"  (1.626 m), weight 66 kg, SpO2 99 %, unknown if currently breastfeeding.  Physical Exam:  General: fatigued and pale Lochia: deferred Uterine Fundus: deferred Incision: honey comb dressing dry and intact DVT Evaluation: No significant calf/ankle edema. Cardiac: RRR Lungs: CTAB Extremities: pedal and radial pulses 2+ bilaterally  Recent Labs    10/18/22 0445  HGB 11.0*  HCT 32.9*   Assessment/Plan: Pt's current state is likely due to blood loss and postpartum resulting in anemia. (Pt lost about 400ccs during C-section.) Pt also has a borderline low BPS, however they have been consistent with prior Bps and stable. CBC was ordered stat. If Hgb is low we will consider iron infusion vs blood transfusion. Less likely continued blood loss given dry, intact incision, and no bruising noted. Also given that patient is not tachycardic. Less likely dehydration given sustained urine output and bolus administration today.    LOS: 0 days   Angelique Holm, MS3 10/18/2022, 5:13 PM  Attested by: Abram Sander MD PGY1

## 2022-10-18 NOTE — Progress Notes (Signed)
Pt requesting repeat bedside US- confirmed breech presentation.Risk benefits and alternatives of cesarean section were discussed with the patient including but not limited to infection, bleeding, damage to bowel , bladder and baby with the need for further surgery. Discussed alternatives including ECV and/or breech delivery.  Discussed risk of breech delivery with patient.  After much discussion, pt agreeable to proceed with primary C-section.  Pt voiced understanding and desires to proceed.  Janyth Pupa, DO Attending [place], [facility] for [employer], [place]

## 2022-10-18 NOTE — Lactation Note (Signed)
This note was copied from a baby's chart. Lactation Consultation Note  Patient Name: Valerie [name] [id number] Date: 10/18/2022 Reason for consult: Initial assessment;Term Age:30 hours   Per RN, mother declined lactation assistance.   Maternal Data    Feeding Mother's Current Feeding Choice: Breast Milk  LATCH Score                    Lactation Tools Discussed/Used    Interventions    Discharge    Consult Status Consult Status: Complete (mother declined follow up)    [name] R [name] 10/18/2022, 9:15 AM

## 2022-10-18 NOTE — Progress Notes (Signed)
Dr. Nelda Marseille at bedside.

## 2022-10-18 NOTE — Anesthesia Procedure Notes (Signed)
Spinal  Patient location during procedure: OR Start time: 10/18/2022 6:09 AM End time: 10/18/2022 6:10 AM Reason for block: surgical anesthesia Staffing Performed: anesthesiologist  Anesthesiologist: Nilda Simmer, MD Performed by: Nilda Simmer, MD Authorized by: Nilda Simmer, MD   Preanesthetic Checklist Completed: patient identified, IV checked, site marked, risks and benefits discussed, surgical consent, monitors and equipment checked, pre-op evaluation and timeout performed Spinal Block Patient position: sitting Prep: DuraPrep Patient monitoring: blood pressure and continuous pulse ox Approach: midline Location: L3-4 Injection technique: single-shot Needle Needle type: Pencan  Needle gauge: 24 G Needle length: 9 cm Assessment Sensory level: T4 Additional Notes Risks and benefits of neuraxial anesthesia including, but not limited to, infection, bleeding, local anesthetic toxicity, headache, hypotension, back pain, block failure, etc. were discussed with the patient. The patient expressed understanding and consented to the procedure. I confirmed that the patient has no bleeding disorders and is not taking blood thinners. I confirmed the patient's last platelet count with the nurse. Monitors were applied. A time-out was performed immediately prior to the procedure. Sterile technique was used throughout the whole procedure.   _1__ attempt(s)

## 2022-10-19 LAB — CBC
HCT: 31.4 % — ABNORMAL LOW (ref 36.0–46.0)
Hemoglobin: 10.1 g/dL — ABNORMAL LOW (ref 12.0–15.0)
MCH: 26.4 pg (ref 26.0–34.0)
MCHC: 32.2 g/dL (ref 30.0–36.0)
MCV: 82.2 fL (ref 80.0–100.0)
Platelets: 166 10*3/uL (ref 150–400)
RBC: 3.82 MIL/uL — ABNORMAL LOW (ref 3.87–5.11)
RDW: 13.1 % (ref 11.5–15.5)
WBC: 9.6 10*3/uL (ref 4.0–10.5)
nRBC: 0 % (ref 0.0–0.2)

## 2022-10-19 MED ORDER — OXYCODONE-ACETAMINOPHEN 5-325 MG PO TABS: 1.0000 | ORAL_TABLET | ORAL | 0 refills | Status: AC | PRN

## 2022-10-19 MED ORDER — SENNOSIDES-DOCUSATE SODIUM 8.6-50 MG PO TABS: 2.0000 | ORAL_TABLET | Freq: Every day | ORAL | 0 refills | Status: AC

## 2022-10-19 MED ORDER — IBUPROFEN 600 MG PO TABS: 600.0000 mg | ORAL_TABLET | Freq: Four times a day (QID) | ORAL | 0 refills | Status: DC

## 2022-10-20 ENCOUNTER — Other Ambulatory Visit: Payer: Self-pay

## 2022-10-20 ENCOUNTER — Inpatient Hospital Stay (HOSPITAL_COMMUNITY)
Admission: AD | Admit: 2022-10-20 | Discharge: 2022-10-20 | Disposition: A | Discharge status: Home / Self Care | Payer: Medicaid Other | Attending: Family Medicine | Admitting: Family Medicine

## 2022-10-20 ENCOUNTER — Encounter (HOSPITAL_COMMUNITY): Payer: Self-pay | Admitting: Family Medicine

## 2022-10-20 DIAGNOSIS — G8918 Other acute postprocedural pain: Secondary | ICD-10-CM | POA: Diagnosis not present

## 2022-10-20 DIAGNOSIS — O909 Complication of the puerperium, unspecified: Secondary | ICD-10-CM | POA: Diagnosis not present

## 2022-10-20 LAB — URINALYSIS, ROUTINE W REFLEX MICROSCOPIC
Bilirubin Urine: NEGATIVE
Glucose, UA: NEGATIVE mg/dL
Ketones, ur: NEGATIVE mg/dL
Leukocytes,Ua: NEGATIVE
Nitrite: NEGATIVE
Protein, ur: NEGATIVE mg/dL
Specific Gravity, Urine: 1.02 (ref 1.005–1.030)
pH: 6.5 (ref 5.0–8.0)

## 2022-10-20 LAB — URINALYSIS, MICROSCOPIC (REFLEX)

## 2022-10-20 MED ORDER — OXYCODONE HCL 5 MG PO TABS
10.0000 mg | ORAL_TABLET | ORAL | Status: DC | PRN
  Administered 2022-10-20: 10 mg via ORAL
  Filled 2022-10-20: qty 2

## 2022-10-20 MED ORDER — KETOROLAC TROMETHAMINE 10 MG PO TABS: 10.0000 mg | ORAL_TABLET | Freq: Four times a day (QID) | ORAL | 0 refills | Status: AC | PRN

## 2022-10-20 MED ORDER — KETOROLAC TROMETHAMINE 10 MG PO TABS
10.0000 mg | ORAL_TABLET | Freq: Four times a day (QID) | ORAL | Status: DC | PRN
  Administered 2022-10-20: 10 mg via ORAL
  Filled 2022-10-20 (×2): qty 1

## 2022-10-20 MED ORDER — GABAPENTIN 100 MG PO CAPS: 100.0000 mg | ORAL_CAPSULE | Freq: Three times a day (TID) | ORAL | 0 refills | Status: AC

## 2022-10-20 MED ORDER — GABAPENTIN 100 MG PO CAPS
100.0000 mg | ORAL_CAPSULE | Freq: Three times a day (TID) | ORAL | Status: DC
  Administered 2022-10-20: 100 mg via ORAL
  Filled 2022-10-20 (×2): qty 1

## 2022-10-20 NOTE — MAU Provider Note (Signed)
History     CSN: [id number]  Arrival date and time: 10/20/22 1745   Event Date/Time   First Provider Initiated Contact with Patient 10/20/22 2036      Chief Complaint  Patient presents with   Abdominal Pain   32 y.o. [id number] s/p CS 2 days ago presenting with incisional pain. Reports onset since she went home yesterday. Pt reports wanting early discharge because she missed her kids. Rates pain 10/10. Reports taking 1 Percocet yesterday and 1 Ibuprofen today which hasn't helped. Passing flatus. Eating and drinking well. No fevers or chills.    OB History     Gravida  4   Para  4   Term  4   Preterm  0   AB  0   Living  4      SAB  0   IAB  0   Ectopic  0   Multiple  0   Live Births  3           Past Medical History:  Diagnosis Date   Medical history non-contributory    No pertinent past medical history     Past Surgical History:  Procedure Laterality Date   APPENDECTOMY     CESAREAN SECTION N/A 10/18/2022   Procedure: CESAREAN SECTION;  Surgeon: Janyth Pupa, DO;  Location: [facility];  Service: Obstetrics;  Laterality: N/A;   LAPAROSCOPIC APPENDECTOMY  08/21/2012   Procedure: APPENDECTOMY LAPAROSCOPIC;  Surgeon: Edward Jolly, MD;  Location: [facility];  Service: General;  Laterality: N/A;    Family History  Problem Relation Age of Onset   Hypertension Neg Hx     Social History   Tobacco Use   Smoking status: Never   Smokeless tobacco: Never  Substance Use Topics   Alcohol use: No   Drug use: No    Allergies:  Allergies  Allergen Reactions   Pork-Derived Products     No medications prior to admission.    Review of Systems  Constitutional:  Negative for chills and fever.  Gastrointestinal:  Positive for abdominal pain. Negative for nausea and vomiting.  Genitourinary:  Negative for difficulty urinating.   Physical Exam   Blood pressure 116/72, pulse 64, temperature 98 F (36.7 C), temperature source Oral, resp. rate 20,  height 5\' 4"  (1.626 m), weight 64 kg, SpO2 99 %, unknown if currently breastfeeding.  Physical Exam Vitals and nursing note reviewed.  Constitutional:      General: She is not in acute distress.    Appearance: Normal appearance.  HENT:     Head: Normocephalic and atraumatic.  Pulmonary:     Effort: Pulmonary effort is normal. No respiratory distress.  Abdominal:     General: Bowel sounds are normal. There is no distension.     Palpations: Abdomen is soft.     Tenderness: There is no abdominal tenderness.     Comments: Honeycomb to lower abdomen c/d/i  Musculoskeletal:        General: Normal range of motion.     Cervical back: Normal range of motion.  Skin:    General: Skin is warm and dry.  Neurological:     General: No focal deficit present.     Mental Status: She is alert and oriented to person, place, and time.  Psychiatric:        Mood and Affect: Mood normal.        Behavior: Behavior normal.    Results for orders placed or performed during the hospital  encounter of 10/20/22 (from the past 24 hour(s))  Urinalysis, Routine w reflex microscopic Urine, Clean Catch     Status: Abnormal   Collection Time: 10/20/22  8:38 PM  Result Value Ref Range   Color, Urine YELLOW YELLOW   APPearance HAZY (A) CLEAR   Specific Gravity, Urine 1.020 1.005 - 1.030   pH 6.5 5.0 - 8.0   Glucose, UA NEGATIVE NEGATIVE mg/dL   Hgb urine dipstick LARGE (A) NEGATIVE   Bilirubin Urine NEGATIVE NEGATIVE   Ketones, ur NEGATIVE NEGATIVE mg/dL   Protein, ur NEGATIVE NEGATIVE mg/dL   Nitrite NEGATIVE NEGATIVE   Leukocytes,Ua NEGATIVE NEGATIVE  Urinalysis, Microscopic (reflex)     Status: Abnormal   Collection Time: 10/20/22  8:38 PM  Result Value Ref Range   RBC / HPF 11-20 0 - 5 RBC/hpf   WBC, UA 0-5 0 - 5 WBC/hpf   Bacteria, UA FEW (A) NONE SEEN   Squamous Epithelial / HPF 6-10 0 - 5 /HPF   Mucus PRESENT    MAU Course  Procedures Toradol Oxycodone Gabapentin  MDM Labs ordered and  reviewed. Suspect pain d/t inadequate compliance with pain meds. Chart reviewed: noted on DC summary that pt was adamant for early discharge on POD #1 and was stable at that time.  Pt reports improvement in pain after meds given here. Discussed importance of regular pain med regimen. Stable for discharge home.  Assessment and Plan   1. Post-op pain    Discharge home Follow up as scheduled Return precautions  Allergies as of 10/20/2022       Reactions   Pork-derived Products         Medication List     STOP taking these medications    ibuprofen 600 MG tablet Commonly known as: ADVIL       TAKE these medications    gabapentin 100 MG capsule Commonly known as: NEURONTIN Take 1 capsule (100 mg total) by mouth 3 (three) times daily.   ketorolac 10 MG tablet Commonly known as: TORADOL Take 1 tablet (10 mg total) by mouth every 6 (six) hours as needed for moderate pain.   oxyCODONE-acetaminophen 5-325 MG tablet Commonly known as: Percocet Take 1 tablet by mouth every 4 (four) hours as needed for severe pain.   senna-docusate 8.6-50 MG tablet Commonly known as: Senokot-S Take 2 tablets by mouth daily.       Julianne Handler, CNM  10/21/2022 11:03 AM

## 2022-10-20 NOTE — MAU Note (Signed)
.  Valerie [name] is a 32 y.o. at Unknown here in MAU reporting: lower abd pain and pain at incision site since being dc yesterday.  Pt reports pain medication is not helping.    Onset of complaint: 1/21 Pain score: 10 Vitals:   10/20/22 1846  BP: 112/63  Pulse: 69  Resp: 18  Temp: 98.3 F (36.8 C)  SpO2: 100%

## 2022-10-27 NOTE — Anesthesia Postprocedure Evaluation (Signed)
Anesthesia Post Note  Patient: Valerie [name]  Procedure(s) Performed: CESAREAN SECTION (Abdomen)     Patient location during evaluation: PACU Anesthesia Type: Spinal Level of consciousness: awake Pain management: pain level controlled Vital Signs Assessment: post-procedure vital signs reviewed and stable Respiratory status: spontaneous breathing, respiratory function stable and nonlabored ventilation Cardiovascular status: blood pressure returned to baseline and stable Postop Assessment: no headache, no backache and no apparent nausea or vomiting Anesthetic complications: no   No notable events documented.  Last Vitals:  Vitals:   10/19/22 0532 10/19/22 0826  BP: (!) 88/50 (!) 96/51  Pulse: 60 71  Resp: 18 20  Temp: 36.6 C   SpO2: 99% 100%    Last Pain:  Vitals:   10/19/22 0820  TempSrc:   PainSc: 0-No pain   Pain Goal:                   Nilda Simmer

## 2022-10-28 ENCOUNTER — Encounter: Payer: Self-pay | Admitting: Family Medicine

## 2022-10-28 ENCOUNTER — Ambulatory Visit (INDEPENDENT_AMBULATORY_CARE_PROVIDER_SITE_OTHER): Payer: Medicaid Other | Admitting: Family Medicine

## 2022-10-28 VITALS — BP 110/75 | HR 76 | Ht 63.0 in | Wt 128.0 lb

## 2022-10-28 DIAGNOSIS — Z4889 Encounter for other specified surgical aftercare: Secondary | ICD-10-CM | POA: Diagnosis not present

## 2022-10-28 NOTE — Progress Notes (Signed)
Here for Incision check s/p C/s 10/18/22. Declines Interpreter . Incision covered by honeycomb dressing. Dressing removed without difficulty. Incision CDI with visible dried skin glue. Incision mildly reddened. Pt c/o moderate itching around incision. Discussed may be reaction to wound glue. Reviewed wound care and encouraged to use toradol as needed for pain due to difficulty in lying down, getting up.  Dr. Ernestina Patches in to see patient and examine wound.  Patient will schedule postpartum visit at health department where she received prenatal care.  Staci Acosta

## 2022-10-28 NOTE — Progress Notes (Signed)
   Subjective:    Valerie [name] is a 32 y.o. female who presents to the clinic status post LTCS on 10/18/22. The patient is not having any pain.  Eating a regular diet without difficulty. Bowel movements are normal. No other significant postoperative concerns.  The following portions of the patient's history were reviewed and updated as appropriate: allergies, current medications, past family history, past medical history, past social history, past surgical history, and problem list..    Review of Systems Pertinent items are noted in HPI.   Objective:   BP 110/75   Pulse 76   Ht 5\' 3"  (1.6 m)   Wt 128 lb (58.1 kg)   BMI 22.67 kg/m  Constitutional:  Well-developed, well-nourished female in no acute distress.   Skin: Skin is warm and dry, no rash noted, not diaphoretic,no erythema, no pallor.  Cardiovascular: Normal heart rate noted  Respiratory: Effort and breath sounds normal, no problems with respiration noted  Abdomen: Soft, bowel sounds active, non-tender, no abnormal masses  Incision: Healing well, no drainage, mild erythema- only under the dermabond, no hernia, no seroma, no swelling, no dehiscence, incision well approximated  Pelvic:   Deferred   Assessment:   Doing well postoperatively.     Plan:   1. Continue any current medications. 2. Wound care discussed.  - Appears to have some mild reaction to the adhesive/dermabond  - Recommended OTC benadryl  - Hydrocortisone to area after the dermabond is gone 3. Activity restrictions: no driving 4. Anticipated return to work: 2-3 weeks. 5. Follow up as needed 6.  Routine preventative health maintenance measures emphasized.  No future appointments.   Please refer to After Visit Summary for other counseling recommendations.

## 2023-04-09 ENCOUNTER — Ambulatory Visit (INDEPENDENT_AMBULATORY_CARE_PROVIDER_SITE_OTHER): Payer: Self-pay

## 2023-04-09 ENCOUNTER — Encounter: Payer: Self-pay | Admitting: Physician Assistant

## 2023-04-09 ENCOUNTER — Ambulatory Visit: Payer: Medicaid Other | Admitting: Physician Assistant

## 2023-04-09 VITALS — BP 108/67 | HR 62 | Ht 63.0 in | Wt 128.0 lb

## 2023-04-09 DIAGNOSIS — N3001 Acute cystitis with hematuria: Secondary | ICD-10-CM | POA: Diagnosis not present

## 2023-04-09 LAB — POCT URINALYSIS DIP (CLINITEK)
Bilirubin, UA: NEGATIVE
Glucose, UA: NEGATIVE mg/dL
Ketones, POC UA: NEGATIVE mg/dL
Nitrite, UA: NEGATIVE
POC PROTEIN,UA: 30 — AB
Spec Grav, UA: >=1.03 — AB (ref 1.010–1.025)
Urobilinogen, UA: 0.2 E.U./dL
pH, UA: 6 (ref 5.0–8.0)

## 2023-04-09 MED ORDER — NITROFURANTOIN MONOHYD MACRO 100 MG PO CAPS: 100.0000 mg | ORAL_CAPSULE | Freq: Two times a day (BID) | ORAL | 0 refills | Status: AC

## 2023-04-09 NOTE — Patient Instructions (Signed)
You are going to take macrobid twice a day for 5 days.  I do encourage you to increase your water intake, you should be drinking at least 64 ounces of water a day.  You can purchase AZO over-the-counter to help with the discomfort from urination.  We will call you with today's lab result.  I hope that you feel better soon.  Roney Jaffe, PA-C Physician Assistant [facility] [email or web address]   Urinary Tract Infection, Adult  A urinary tract infection (UTI) is an infection of any part of the urinary tract. The urinary tract includes the kidneys, ureters, bladder, and urethra. These organs make, store, and get rid of urine in the body. An upper UTI affects the ureters and kidneys. A lower UTI affects the bladder and urethra. What are the causes? Most urinary tract infections are caused by bacteria in your genital area around your urethra, where urine leaves your body. These bacteria grow and cause inflammation of your urinary tract. What increases the risk? You are more likely to develop this condition if: You have a urinary catheter that stays in place. You are not able to control when you urinate or have a bowel movement (incontinence). You are female and you: Use a spermicide or diaphragm for birth control. Have low estrogen levels. Are pregnant. You have certain genes that increase your risk. You are sexually active. You take antibiotic medicines. You have a condition that causes your flow of urine to slow down, such as: An enlarged prostate, if you are female. Blockage in your urethra. A kidney stone. A nerve condition that affects your bladder control (neurogenic bladder). Not getting enough to drink, or not urinating often. You have certain medical conditions, such as: Diabetes. A weak disease-fighting system (immunesystem). Sickle cell disease. Gout. Spinal cord injury. What are the signs or symptoms? Symptoms  of this condition include: Needing to urinate right away (urgency). Frequent urination. This may include small amounts of urine each time you urinate. Pain or burning with urination. Blood in the urine. Urine that smells bad or unusual. Trouble urinating. Cloudy urine. Vaginal discharge, if you are female. Pain in the abdomen or the lower back. You may also have: Vomiting or a decreased appetite. Confusion. Irritability or tiredness. A fever or chills. Diarrhea. The first symptom in older adults may be confusion. In some cases, they may not have any symptoms until the infection has worsened. How is this diagnosed? This condition is diagnosed based on your medical history and a physical exam. You may also have other tests, including: Urine tests. Blood tests. Tests for STIs (sexually transmitted infections). If you have had more than one UTI, a cystoscopy or imaging studies may be done to determine the cause of the infections. How is this treated? Treatment for this condition includes: Antibiotic medicine. Over-the-counter medicines to treat discomfort. Drinking enough water to stay hydrated. If you have frequent infections or have other conditions such as a kidney stone, you may need to see a health care provider who specializes in the urinary tract (urologist). In rare cases, urinary tract infections can cause sepsis. Sepsis is a life-threatening condition that occurs when the body responds to an infection. Sepsis is treated in the hospital with IV antibiotics, fluids, and other medicines. Follow these instructions at home:  Medicines Take over-the-counter and prescription medicines only as told by your health care provider. If you were prescribed an antibiotic medicine, take it as told by your health care provider. Do not stop using  the antibiotic even if you start to feel better. General instructions Make sure you: Empty your bladder often and completely. Do not hold urine for  long periods of time. Empty your bladder after sex. Wipe from front to back after urinating or having a bowel movement if you are female. Use each tissue only one time when you wipe. Drink enough fluid to keep your urine pale yellow. Keep all follow-up visits. This is important. Contact a health care provider if: Your symptoms do not get better after 1-2 days. Your symptoms go away and then return. Get help right away if: You have severe pain in your back or your lower abdomen. You have a fever or chills. You have nausea or vomiting. Summary A urinary tract infection (UTI) is an infection of any part of the urinary tract, which includes the kidneys, ureters, bladder, and urethra. Most urinary tract infections are caused by bacteria in your genital area. Treatment for this condition often includes antibiotic medicines. If you were prescribed an antibiotic medicine, take it as told by your health care provider. Do not stop using the antibiotic even if you start to feel better. Keep all follow-up visits. This is important. This information is not intended to replace advice given to you by your health care provider. Make sure you discuss any questions you have with your health care provider. Document Revised: 04/23/2020 Document Reviewed: 04/28/2020 Elsevier Patient Education  2024 [employer].

## 2023-04-09 NOTE — Progress Notes (Unsigned)
Established Patient Office Visit  Subjective   Patient ID: Valerie [name], female    DOB: April 11, 1991  Age: 32 y.o. MRN: [id number]  Chief Complaint  Patient presents with   Dysuria    Noticed a few weeks ago, pain has increased over time.     States that she has been experiencing increased frequency, urgency, dysuria, or hematuria over the past couple of weeks.  States that she feels that she is not producing as much urine as she normally does.  Denies suprapubic discomfort or low back pain.  Denies nausea, vomiting, fever or chills.  States that she does not drink very much water on a daily basis, states that she has noticed if she drinks more water it does slightly improve.  Otherwise has not tried anything for relief.  Is not currently breast-feeding.    No interpreter was used, patient speaks [place] proficiently  Husband is present    Past Medical History:  Diagnosis Date   Medical history non-contributory    No pertinent past medical history    Social History   Socioeconomic History   Marital status: Married    Spouse name: Not on file   Number of children: Not on file   Years of education: Not on file   Highest education level: Not on file  Occupational History   Not on file  Tobacco Use   Smoking status: Never    Passive exposure: Never   Smokeless tobacco: Never  Substance and Sexual Activity   Alcohol use: No   Drug use: No   Sexual activity: Yes    Birth control/protection: I.U.D.  Other Topics Concern   Not on file  Social History Narrative   Not on file   Social Determinants of Health   Financial Resource Strain: Not on file  Food Insecurity: No Food Insecurity (10/19/2022)   Hunger Vital Sign    Worried About Running Out of Food in the Last Year: Never true    Ran Out of Food in the Last Year: Never true  Transportation Needs: No Transportation Needs (10/19/2022)   PRAPARE - Administrator, Civil Service (Medical): No    Lack of  Transportation (Non-Medical): No  Physical Activity: Not on file  Stress: Not on file  Social Connections: Unknown (02/12/2022)   Received from [facility]   Social Network    Social Network: Not on file  Intimate Partner Violence: Not At Risk (10/19/2022)   Humiliation, Afraid, Rape, and Kick questionnaire    Fear of Current or Ex-Partner: No    Emotionally Abused: No    Physically Abused: No    Sexually Abused: No   Family History  Problem Relation Age of Onset   Hypertension Neg Hx    Allergies  Allergen Reactions   Pork-Derived Products     Review of Systems  Constitutional:  Negative for chills and fever.  HENT: Negative.    Eyes: Negative.   Respiratory:  Negative for shortness of breath.   Cardiovascular:  Negative for chest pain.  Gastrointestinal:  Negative for abdominal pain, nausea and vomiting.  Genitourinary:  Positive for dysuria, frequency, hematuria and urgency.  Musculoskeletal:  Negative for back pain.  Skin: Negative.   Neurological: Negative.   Psychiatric/Behavioral: Negative.        Objective:     BP 108/67 (BP Location: Left Arm, Patient Position: Sitting, Cuff Size: Normal)   Pulse 62   Ht 5\' 3"  (1.6 m)   Wt  128 lb (58.1 kg)   LMP 04/08/2023   SpO2 100%   BMI 22.67 kg/m    Physical Exam Vitals and nursing note reviewed.  Constitutional:      Appearance: Normal appearance.  HENT:     Head: Normocephalic and atraumatic.     Right Ear: External ear normal.     Left Ear: External ear normal.     Nose: Nose normal.     Mouth/Throat:     Mouth: Mucous membranes are moist.     Pharynx: Oropharynx is clear.  Eyes:     Extraocular Movements: Extraocular movements intact.     Conjunctiva/sclera: Conjunctivae normal.     Pupils: Pupils are equal, round, and reactive to light.  Cardiovascular:     Rate and Rhythm: Normal rate and regular rhythm.     Pulses: Normal pulses.     Heart sounds: Normal heart sounds.  Pulmonary:     Effort:  Pulmonary effort is normal.     Breath sounds: Normal breath sounds.  Abdominal:     Tenderness: There is no abdominal tenderness. There is no right CVA tenderness or left CVA tenderness.  Musculoskeletal:        General: Normal range of motion.     Cervical back: Normal range of motion and neck supple.  Skin:    General: Skin is warm and dry.  Neurological:     General: No focal deficit present.     Mental Status: She is alert and oriented to person, place, and time.  Psychiatric:        Mood and Affect: Mood normal.        Behavior: Behavior normal.        Thought Content: Thought content normal.        Judgment: Judgment normal.      Results for orders placed or performed in visit on 04/09/23  POCT URINALYSIS DIP (CLINITEK)  Result Value Ref Range   Color, UA orange (A) yellow   Clarity, UA cloudy (A) clear   Glucose, UA negative negative mg/dL   Bilirubin, UA negative negative   Ketones, POC UA negative negative mg/dL   Spec Grav, UA >=1.610 (A) 1.010 - 1.025   Blood, UA large (A) negative   pH, UA 6.0 5.0 - 8.0   POC PROTEIN,UA =30 (A) negative, trace   Urobilinogen, UA 0.2 0.2 or 1.0 E.U./dL   Nitrite, UA Negative Negative   Leukocytes, UA Small (1+) (A) Negative      Assessment & Plan:   Problem List Items Addressed This Visit   None Visit Diagnoses     Acute cystitis with hematuria    -  Primary   Relevant Medications   nitrofurantoin, macrocrystal-monohydrate, (MACROBID) 100 MG capsule   Other Relevant Orders   POCT URINALYSIS DIP (CLINITEK) (Completed)   Urine Culture      1. Acute cystitis with hematuria UA negative for urinary tract infection, however will treat patient based on clinical presentation.  Urine sent for culture.  Trial Macrobid.  Patient education given on supportive care.  Red flags given for prompt reevaluation. - POCT URINALYSIS DIP (CLINITEK) - nitrofurantoin, macrocrystal-monohydrate, (MACROBID) 100 MG capsule; Take 1 capsule  (100 mg total) by mouth 2 (two) times daily for 5 days.  Dispense: 10 capsule; Refill: 0   I have reviewed the patient's medical history (PMH, PSH, Social History, Family History, Medications, and allergies) , and have been updated if relevant. I spent 30 minutes reviewing chart and  face to face time with patient.    Return if symptoms worsen or fail to improve.    Kasandra Knudsen Mayers, PA-C

## 2023-04-09 NOTE — Telephone Encounter (Signed)
Spouse called in stated patient in burning with urination and some blood on urine. Frequently uses the bathroom but there is only a drop. Spouse is asking if she can't be seen today if the provider will call in something for the symptoms       Chief Complaint: Urinary pain, burning, frequency, blood in urine Symptoms: Above Frequency: 1-2 weeks ago Pertinent Negatives: Patient denies fever Disposition: [] ED /[] Urgent Care (no appt availability in office) / [] Appointment(In office/virtual)/ []  [facility]/ [] Home Care/ [] Refused Recommended Disposition /[x] [facility]/ []  Follow-up with PCP Additional Notes: No availability in the practice. Reason for Disposition  Urinating more frequently than usual (i.e., frequency)  Answer Assessment - Initial Assessment Questions 1. SYMPTOM: "What's the main symptom you're concerned about?" (e.g., frequency, incontinence)     Frequency, some blood , burning 2. ONSET: "When did the    start?"     1-2 weeks ago 3. PAIN: "Is there any pain?" If Yes, ask: "How bad is it?" (Scale: 1-10; mild, moderate, severe)     Moderate 4. CAUSE: "What do you think is causing the symptoms?"     UTI 5. OTHER SYMPTOMS: "Do you have any other symptoms?" (e.g., blood in urine, fever, flank pain, pain with urination)     Blood in urine 6. PREGNANCY: "Is there any chance you are pregnant?" "When was your last menstrual period?"     No  Protocols used: Urinary Symptoms-A-AH

## 2023-04-11 LAB — URINE CULTURE

## 2023-04-14 MED ORDER — SULFAMETHOXAZOLE-TRIMETHOPRIM 800-160 MG PO TABS: 1.0000 | ORAL_TABLET | Freq: Two times a day (BID) | ORAL | 0 refills | Status: AC

## 2023-04-14 NOTE — Addendum Note (Signed)
Addended by: Roney Jaffe on: 04/14/2023 09:53 AM   Modules accepted: Orders
